# Patient Record
Sex: Male | Born: 1990 | Race: Black or African American | Hispanic: No | Marital: Single | Smoking: Former smoker
Health system: Southern US, Community
[De-identification: ages and names within clinical notes are randomized; demographics above are authoritative.]

---

## 2015-07-08 ENCOUNTER — Emergency Department (HOSPITAL_COMMUNITY)
Admission: EM | Admit: 2015-07-08 | Discharge: 2015-07-08 | Disposition: A | Discharge status: Home / Self Care | Payer: Self-pay | Attending: Emergency Medicine | Admitting: Emergency Medicine

## 2015-07-08 ENCOUNTER — Encounter (HOSPITAL_COMMUNITY): Payer: Self-pay | Admitting: Emergency Medicine

## 2015-07-08 DIAGNOSIS — F17200 Nicotine dependence, unspecified, uncomplicated: Secondary | ICD-10-CM | POA: Insufficient documentation

## 2015-07-08 DIAGNOSIS — L0291 Cutaneous abscess, unspecified: Secondary | ICD-10-CM

## 2015-07-08 DIAGNOSIS — L0231 Cutaneous abscess of buttock: Secondary | ICD-10-CM | POA: Insufficient documentation

## 2015-07-08 MED ORDER — LIDOCAINE-EPINEPHRINE 1 %-1:100000 IJ SOLN
10.0000 mL | Freq: Once | INTRAMUSCULAR | Status: AC
  Administered 2015-07-08: 10 mL via INTRADERMAL
  Filled 2015-07-08: qty 1

## 2015-07-08 NOTE — Discharge Instructions (Signed)
Incision and Drainage °Incision and drainage is a procedure in which a sac-like structure (cystic structure) is opened and drained. The area to be drained usually contains material such as pus, fluid, or blood.  °LET YOUR CAREGIVER KNOW ABOUT:  °· Allergies to medicine. °· Medicines taken, including vitamins, herbs, eyedrops, over-the-counter medicines, and creams. °· Use of steroids (by mouth or creams). °· Previous problems with anesthetics or numbing medicines. °· History of bleeding problems or blood clots. °· Previous surgery. °· Other health problems, including diabetes and kidney problems. °· Possibility of pregnancy, if this applies. °RISKS AND COMPLICATIONS °· Pain. °· Bleeding. °· Scarring. °· Infection. °BEFORE THE PROCEDURE  °You may need to have an ultrasound or other imaging tests to see how large or deep your cystic structure is. Blood tests may also be used to determine if you have an infection or how severe the infection is. You may need to have a tetanus shot. °PROCEDURE  °The affected area is cleaned with a cleaning fluid. The cyst area will then be numbed with a medicine (local anesthetic). A small incision will be made in the cystic structure. A syringe or catheter may be used to drain the contents of the cystic structure, or the contents may be squeezed out. The area will then be flushed with a cleansing solution. After cleansing the area, it is often gently packed with a gauze or another wound dressing. Once it is packed, it will be covered with gauze and tape or some other type of wound dressing.  °AFTER THE PROCEDURE  °· Often, you will be allowed to go home right after the procedure. °· You may be given antibiotic medicine to prevent or heal an infection. °· If the area was packed with gauze or some other wound dressing, you will likely need to come back in 1 to 2 days to get it removed. °· The area should heal in about 14 days. °  °This information is not intended to replace advice given  to you by your health care provider. Make sure you discuss any questions you have with your health care provider. °  °Document Released: 10/17/2000 Document Revised: 10/23/2011 Document Reviewed: 06/18/2011 °Elsevier Interactive Patient Education ©2016 Else[employer] Inc. ° °Abscess °An abscess is an infected area that contains a collection of pus and debris. It can occur in almost any part of the body. An abscess is also known as a furuncle or boil. °CAUSES  °An abscess occurs when tissue gets infected. This can occur from blockage of oil or sweat glands, infection of hair follicles, or a minor injury to the skin. As the body tries to fight the infection, pus collects in the area and creates pressure under the skin. This pressure causes pain. People with weakened immune systems have difficulty fighting infections and get certain abscesses more often.  °SYMPTOMS °Usually an abscess develops on the skin and becomes a painful mass that is red, warm, and tender. If the abscess forms under the skin, you may feel a moveable soft area under the skin. Some abscesses break open (rupture) on their own, but most will continue to get worse without care. The infection can spread deeper into the body and eventually into the bloodstream, causing you to feel ill.  °DIAGNOSIS  °Your caregiver will take your medical history and perform a physical exam. A sample of fluid may also be taken from the abscess to determine what is causing your infection. °TREATMENT  °Your caregiver may prescribe antibiotic medicines to fight the infection.   However, taking antibiotics alone usually does not cure an abscess. Your caregiver may need to make a small cut (incision) in the abscess to drain the pus. In some cases, gauze is packed into the abscess to reduce pain and to continue draining the area. °HOME CARE INSTRUCTIONS  °· Only take over-the-counter or prescription medicines for pain, discomfort, or fever as directed by your caregiver. °· If you were  prescribed antibiotics, take them as directed. Finish them even if you start to feel better. °· If gauze is used, follow your caregiver's directions for changing the gauze. °· To avoid spreading the infection: °¨ Keep your draining abscess covered with a bandage. °¨ Wash your hands well. °¨ Do not share personal care items, towels, or whirlpools with others. °¨ Avoid skin contact with others. °· Keep your skin and clothes clean around the abscess. °· Keep all follow-up appointments as directed by your caregiver. °SEEK MEDICAL CARE IF:  °· You have increased pain, swelling, redness, fluid drainage, or bleeding. °· You have muscle aches, chills, or a general ill feeling. °· You have a fever. °MAKE SURE YOU:  °· Understand these instructions. °· Will watch your condition. °· Will get help right away if you are not doing well or get worse. °  °This information is not intended to replace advice given to you by your health care provider. Make sure you discuss any questions you have with your health care provider. °  °Document Released: 01/31/2005 Document Revised: 10/23/2011 Document Reviewed: 07/06/2011 °Elsevier Interactive Patient Education ©2016 Elsevier Inc. ° °

## 2015-07-08 NOTE — ED Provider Notes (Signed)
CSN: [id number]     Arrival date & time 07/08/15  0840 History  By signing my name below, I, Nathan Dixon, attest that this documentation has been prepared under the direction and in the presence of [employer], PA-C.  Electronically Signed: Lyndel Dixon, ED Scribe. 07/08/2015. 9:28 AM.   Chief Complaint  Patient presents with  . Abscess   The history is provided by the patient. No language interpreter was used.   HPI Comments: Nathan Dixon is a 25 y.o. male, with a h/o abscesses, who presents to the Emergency Department complaining of a constant, gradually worsening area of pain and swelling to right buttock X 3 days, with more recent onset of purulent drainage from the area. Pt reports a significant history of abscesses to buttocks and states the areas normally drain after soaking in hot water, without intervention by a medical provider. He denies relief of his current complaint after soaking in hot water. The pt has not been evaluated by a dermatologist in the past for h/o abscesses. He denies fevers, chills, nausea, vomiting, or any other associated symptoms or areas of infection.  History reviewed. No pertinent past medical history. History reviewed. No pertinent past surgical history. History reviewed. No pertinent family history. Social History  Substance Use Topics  . Smoking status: Current Every Day Smoker  . Smokeless tobacco: None  . Alcohol Use: Yes     Comment: occ    Review of Systems  Constitutional: Negative for fever and chills.  Gastrointestinal: Negative for nausea and vomiting.  Skin: Positive for wound (draining abscess to right buttock ).   Allergies  Review of patient's allergies indicates no known allergies.  Home Medications   Prior to Admission medications   Not on File   BP 114/71 mmHg  Pulse 66  Temp(Src) 98.1 F (36.7 C) (Oral)  Resp 18  SpO2 99%  Physical Exam  Constitutional: He is oriented to person, place, and time. He appears  well-developed and well-nourished. No distress.  HENT:  Head: Normocephalic.  Eyes: Conjunctivae are normal.  Neck: Normal range of motion. Neck supple.  Cardiovascular: Normal rate.   Pulmonary/Chest: Effort normal. No respiratory distress.  Musculoskeletal: Normal range of motion.  1.5 cm abscess to left buttock  Neurological: He is alert and oriented to person, place, and time. Coordination normal.  Skin: Skin is warm.  Psychiatric: He has a normal mood and affect. His behavior is normal.  Nursing note and vitals reviewed.   ED Course  Procedures  DIAGNOSTIC STUDIES: Oxygen Saturation is 99% on RA, normal by my interpretation.    COORDINATION OF CARE: 9:17 AM Discussed treatment plan with pt which includes to perform I & D procedure and pt agreed to plan.  INCISION AND DRAINAGE PROCEDURE NOTE: Patient identification was confirmed and verbal consent was obtained. This procedure was performed by Jeralyn Bennett, PA-C at 9:19 AM. Site: right buttock  Sterile procedures observed Needle size: 27 Anesthetic used (type and amt): 2 cc of lidocaine 1% with epinephrine  Blade size: 11  Drainage: copious purulent, bloody drainage  Site anesthetized, incision made over site, wound drained and explored loculations, rinsed with copious amounts of normal saline, wound covered with dry, sterile dressing.  Pt tolerated procedure well without complications.  Instructions for care discussed verbally and pt provided with additional written instructions for homecare and f/u.   MDM   Final diagnoses:  Abscess   Labs: N/A  Imaging: N/A  Consults: N/A  Therapeutics: N/A  Discharge Meds:  N/A  Assessment/Plan: Perform I&D procedure.    Patient with skin abscess. Incision and drainage performed in the ED today.  Abscess was not large enough to warrant packing or drain placement. Wound recheck in 3 days. Supportive care and return precautions discussed. Pt advised to take ibuprofen as  needed and to take a bath BID until he is seen for wound recheck in 3 days. The patient appears reasonably screened and/or stabilized for discharge and I doubt any other emergent medical condition requiring further screening, evaluation, or treatment in the ED prior to discharge.   I personally performed the services described in this documentation, which was scribed in my presence. The recorded information has been reviewed and is accurate.  Eyvonne MechanicJeffrey Vendetta Pittinger, PA-C 07/08/15 1408  Tilden FossaElizabeth Rees, MD 07/09/15 1100

## 2015-07-08 NOTE — ED Notes (Signed)
Pt c/o abscess on right buttocks area; pt sts hx of same

## 2015-08-19 ENCOUNTER — Emergency Department (HOSPITAL_COMMUNITY)
Admission: EM | Admit: 2015-08-19 | Discharge: 2015-08-19 | Disposition: A | Discharge status: Home / Self Care | Payer: Self-pay | Attending: Emergency Medicine | Admitting: Emergency Medicine

## 2015-08-19 ENCOUNTER — Encounter (HOSPITAL_COMMUNITY): Payer: Self-pay | Admitting: Emergency Medicine

## 2015-08-19 DIAGNOSIS — F17200 Nicotine dependence, unspecified, uncomplicated: Secondary | ICD-10-CM | POA: Insufficient documentation

## 2015-08-19 DIAGNOSIS — J029 Acute pharyngitis, unspecified: Secondary | ICD-10-CM | POA: Insufficient documentation

## 2015-08-19 LAB — RAPID STREP SCREEN (MED CTR MEBANE ONLY): STREPTOCOCCUS, GROUP A SCREEN (DIRECT): NEGATIVE

## 2015-08-19 MED ORDER — IBUPROFEN 200 MG PO TABS
600.0000 mg | ORAL_TABLET | Freq: Once | ORAL | Status: AC
  Administered 2015-08-19: 600 mg via ORAL
  Filled 2015-08-19: qty 3

## 2015-08-19 NOTE — Discharge Instructions (Signed)
Treatment: You may alternate ibuprofen and Tylenol every 3 hours for your pain. You may use Chloraseptic throat spray that you can find over-the-counter for your pain. You can find throat lozenges that are seething to your throat at a local drugstore. If you're strep culture is positive, you will get a call from the hospital and he will be prescribed antibiotics at that time.  Follow-up: Please follow-up with your primary care doctor as needed if symptoms worsen or do not improve within the week. Please return to emergency department if your symptoms worsen, you develop a fever, your pain is worsening, your speech is affected, you cannot open your mouth, or any other new or concerning symptoms.   Pharyngitis Pharyngitis is redness, pain, and swelling (inflammation) of your pharynx.  CAUSES  Pharyngitis is usually caused by infection. Most of the time, these infections are from viruses (viral) and are part of a cold. However, sometimes pharyngitis is caused by bacteria (bacterial). Pharyngitis can also be caused by allergies. Viral pharyngitis may be spread from person to person by coughing, sneezing, and personal items or utensils (cups, forks, spoons, toothbrushes). Bacterial pharyngitis may be spread from person to person by more intimate contact, such as kissing.  SIGNS AND SYMPTOMS  Symptoms of pharyngitis include:   Sore throat.   Tiredness (fatigue).   Low-grade fever.   Headache.  Joint pain and muscle aches.  Skin rashes.  Swollen lymph nodes.  Plaque-like film on throat or tonsils (often seen with bacterial pharyngitis). DIAGNOSIS  Your health care provider will ask you questions about your illness and your symptoms. Your medical history, along with a physical exam, is often all that is needed to diagnose pharyngitis. Sometimes, a rapid strep test is done. Other lab tests may also be done, depending on the suspected cause.  TREATMENT  Viral pharyngitis will usually get  better in 3-4 days without the use of medicine. Bacterial pharyngitis is treated with medicines that kill germs (antibiotics).  HOME CARE INSTRUCTIONS   Drink enough water and fluids to keep your urine clear or pale yellow.   Only take over-the-counter or prescription medicines as directed by your health care provider:   If you are prescribed antibiotics, make sure you finish them even if you start to feel better.   Do not take aspirin.   Get lots of rest.   Gargle with 8 oz of salt water ( tsp of salt per 1 qt of water) as often as every 1-2 hours to soothe your throat.   Throat lozenges (if you are not at risk for choking) or sprays may be used to soothe your throat. SEEK MEDICAL CARE IF:   You have large, tender lumps in your neck.  You have a rash.  You cough up green, yellow-brown, or bloody spit. SEEK IMMEDIATE MEDICAL CARE IF:   Your neck becomes stiff.  You drool or are unable to swallow liquids.  You vomit or are unable to keep medicines or liquids down.  You have severe pain that does not go away with the use of recommended medicines.  You have trouble breathing (not caused by a stuffy nose). MAKE SURE YOU:   Understand these instructions.  Will watch your condition.  Will get help right away if you are not doing well or get worse.   This information is not intended to replace advice given to you by your health care provider. Make sure you discuss any questions you have with your health care provider.  Document Released: 04/23/2005 Document Revised: 02/11/2013 Document Reviewed: 12/29/2012 Elsevier Interactive Patient Education [employer]2016 Elsevier Inc.

## 2015-08-19 NOTE — ED Provider Notes (Signed)
CSN: [id number]     Arrival date & time 08/19/15  [id number] History   First MD Initiated Contact with Patient 08/19/15 [phone]     Chief Complaint  Patient presents with  . Sore Throat     (Consider location/radiation/quality/duration/timing/severity/associated sxs/prior Treatment) HPI Comments: Patient is a 25 year old previously healthy male who presents today with sore throat. Patient has had the sore throat for 2 days. He has an associated infrequent cough is nonproductive. Is no associated congestion or fevers. Patient has not tried any medications at home but has gargled with salt water without much relief. Patient has no known sick contacts. Patient denies feeling of his throat is closing. Patient denies any chest pain, shortness of breath, abdominal pain, nausea, vomiting, dysuria.  Patient is a 25 y.o. male presenting with pharyngitis. The history is provided by the patient.  Sore Throat Associated symptoms include coughing and a sore throat. Pertinent negatives include no abdominal pain, chest pain, chills, fever, headaches, nausea, rash or vomiting.    History reviewed. No pertinent past medical history. History reviewed. No pertinent past surgical history. History reviewed. No pertinent family history. Social History  Substance Use Topics  . Smoking status: Current Every Day Smoker  . Smokeless tobacco: None  . Alcohol Use: Yes     Comment: occ    Review of Systems  Constitutional: Negative for fever and chills.  HENT: Positive for sore throat. Negative for facial swelling.   Respiratory: Positive for cough. Negative for shortness of breath.   Cardiovascular: Negative for chest pain.  Gastrointestinal: Negative for nausea, vomiting and abdominal pain.  Genitourinary: Negative for dysuria.  Musculoskeletal: Negative for back pain.  Skin: Negative for rash and wound.  Neurological: Negative for headaches.  Psychiatric/Behavioral: The patient is not nervous/anxious.        Allergies  Review of patient's allergies indicates no known allergies.  Home Medications   Prior to Admission medications   Not on File   BP 124/70 mmHg  Pulse 86  Temp(Src) 98.8 F (37.1 C) (Oral)  Resp 16  Ht 5\' 11"  (1.803 m)  Wt 79.379 kg  BMI 24.42 kg/m2  SpO2 100% Physical Exam  Constitutional: He appears well-developed and well-nourished. No distress.  HENT:  Head: Normocephalic and atraumatic.  Mouth/Throat: Uvula is midline and mucous membranes are normal. No uvula swelling. Oropharyngeal exudate, posterior oropharyngeal edema and posterior oropharyngeal erythema present. No tonsillar abscesses.  Eyes: Conjunctivae are normal. Pupils are equal, round, and reactive to light. Right eye exhibits no discharge. Left eye exhibits no discharge. No scleral icterus.  Neck: Normal range of motion. Neck supple. No thyromegaly present.  Cardiovascular: Normal rate, regular rhythm and normal heart sounds.  Exam reveals no gallop and no friction rub.   No murmur heard. Pulmonary/Chest: Effort normal and breath sounds normal. No stridor. No respiratory distress. He has no wheezes. He has no rales.  Abdominal: Soft. Bowel sounds are normal. He exhibits no distension. There is no tenderness. There is no rebound and no guarding.  Musculoskeletal: He exhibits no edema.  Lymphadenopathy:    He has no cervical adenopathy.  Neurological: He is alert. Coordination normal.  Skin: Skin is warm and dry. No rash noted. He is not diaphoretic. No pallor.  Psychiatric: He has a normal mood and affect.  Nursing note and vitals reviewed.   ED Course  Procedures (including critical care time) Labs Review Labs Reviewed  [facility]AP[facility]EN [facility])  CULTURE, GROUP A STREP [facility])  Imaging Review No results found. I have personally reviewed and evaluated these images and lab results as part of my medical decision-making.   EKG Interpretation None      MDM   Pt with  negative strep. Diagnosis of viral pharyngitis. No abx indicated at this time. Discussed that results of strep culture are pending and patient will be informed if positive result and abx will be called in at that time. Discharge with symptomatic tx. No evidence of dehydration. Pt is tolerating secretions. Presentation not concerning for peritonsillar abscess or spread of infection to deep spaces of the throat; patent airway. Specific return precautions discussed. Recommended PCP follow up. Pt vitals stable and appears safe for discharge.   Final diagnoses:  Pharyngitis       Emi Holes, PA-C 08/19/15 1045  Loren Racer, MD 08/23/15 [phone]

## 2015-08-19 NOTE — ED Notes (Signed)
Sore throat for two days. Denies fever or chills at home.

## 2015-08-20 ENCOUNTER — Emergency Department (HOSPITAL_COMMUNITY)
Admission: EM | Admit: 2015-08-20 | Discharge: 2015-08-20 | Disposition: A | Discharge status: Home / Self Care | Payer: Self-pay | Attending: Emergency Medicine | Admitting: Emergency Medicine

## 2015-08-20 ENCOUNTER — Encounter (HOSPITAL_COMMUNITY): Payer: Self-pay | Admitting: *Deleted

## 2015-08-20 DIAGNOSIS — J039 Acute tonsillitis, unspecified: Secondary | ICD-10-CM | POA: Insufficient documentation

## 2015-08-20 DIAGNOSIS — F17200 Nicotine dependence, unspecified, uncomplicated: Secondary | ICD-10-CM | POA: Insufficient documentation

## 2015-08-20 DIAGNOSIS — H9201 Otalgia, right ear: Secondary | ICD-10-CM | POA: Insufficient documentation

## 2015-08-20 MED ORDER — HYDROCODONE-ACETAMINOPHEN 7.5-325 MG/15ML PO SOLN: 10.0000 mL | Freq: Four times a day (QID) | ORAL | Status: DC | PRN

## 2015-08-20 MED ORDER — IBUPROFEN 800 MG PO TABS: 800.0000 mg | ORAL_TABLET | Freq: Three times a day (TID) | ORAL | Status: DC | PRN

## 2015-08-20 MED ORDER — CLINDAMYCIN HCL 150 MG PO CAPS: 300.0000 mg | ORAL_CAPSULE | Freq: Three times a day (TID) | ORAL | Status: DC

## 2015-08-20 NOTE — ED Notes (Signed)
Pt reports being seen yesterday and diagnosed with layrngitis. Pt woke up this am and now has right ear pain. No acute distress noted at triage.

## 2015-08-20 NOTE — ED Notes (Signed)
Patient verbalized understanding of discharge instructions and denies any further needs or questions at this time. VS stable. Patient ambulatory with steady gait.  

## 2015-08-20 NOTE — Discharge Instructions (Signed)
Read the information below.  Use the prescribed medication as directed.  Please discuss all new medications with your pharmacist.  Do not take additional tylenol while taking the prescribed pain medication to avoid overdose.  You may return to the Emergency Department at any time for worsening condition or any new symptoms that concern you.    If you develop high fevers, difficulty swallowing or breathing, or you are unable to tolerate fluids by mouth, return to the ER immediately for a recheck.     Tonsillitis Tonsillitis is an infection of the throat that causes the tonsils to become red, tender, and swollen. Tonsils are collections of lymphoid tissue at the back of the throat. Each tonsil has crevices (crypts). Tonsils help fight nose and throat infections and keep infection from spreading to other parts of the body for the first 18 months of life.  CAUSES Sudden (acute) tonsillitis is usually caused by infection with streptococcal bacteria. Long-lasting (chronic) tonsillitis occurs when the crypts of the tonsils become filled with pieces of food and bacteria, which makes it easy for the tonsils to become repeatedly infected. SYMPTOMS  Symptoms of tonsillitis include:  A sore throat, with possible difficulty swallowing.  White patches on the tonsils.  Fever.  Tiredness.  New episodes of snoring during sleep, when you did not snore before.  Small, foul-smelling, yellowish-white pieces of material (tonsilloliths) that you occasionally cough up or spit out. The tonsilloliths can also cause you to have bad breath. DIAGNOSIS Tonsillitis can be diagnosed through a physical exam. Diagnosis can be confirmed with the results of lab tests, including a throat culture. TREATMENT  The goals of tonsillitis treatment include the reduction of the severity and duration of symptoms and prevention of associated conditions. Symptoms of tonsillitis can be improved with the use of steroids to reduce the  swelling. Tonsillitis caused by bacteria can be treated with antibiotic medicines. Usually, treatment with antibiotic medicines is started before the cause of the tonsillitis is known. However, if it is determined that the cause is not bacterial, antibiotic medicines will not treat the tonsillitis. If attacks of tonsillitis are severe and frequent, your health care provider may recommend surgery to remove the tonsils (tonsillectomy). HOME CARE INSTRUCTIONS   Rest as much as possible and get plenty of sleep.  Drink plenty of fluids. While the throat is very sore, eat soft foods or liquids, such as sherbet, soups, or instant breakfast drinks.  Eat frozen ice pops.  Gargle with a warm or cold liquid to help soothe the throat. Mix 1/4 teaspoon of salt and 1/4 teaspoon of baking soda in 8 oz of water. SEEK MEDICAL CARE IF:   Large, tender lumps develop in your neck.  A rash develops.  A green, yellow-brown, or bloody substance is coughed up.  You are unable to swallow liquids or food for 24 hours.  You notice that only one of the tonsils is swollen. SEEK IMMEDIATE MEDICAL CARE IF:   You develop any new symptoms such as vomiting, severe headache, stiff neck, chest pain, or trouble breathing or swallowing.  You have severe throat pain along with drooling or voice changes.  You have severe pain, unrelieved with recommended medications.  You are unable to fully open the mouth.  You develop redness, swelling, or severe pain anywhere in the neck.  You have a fever. MAKE SURE YOU:   Understand these instructions.  Will watch your condition.  Will get help right away if you are not doing well or  get worse.   This information is not intended to replace advice given to you by your health care provider. Make sure you discuss any questions you have with your health care provider.   Document Released: 01/31/2005 Document Revised: 05/14/2014 Document Reviewed: 10/10/2012 Elsevier  Interactive Patient Education [employer]2016 Elsevier Inc.

## 2015-08-20 NOTE — ED Provider Notes (Signed)
CSN: [id number]     Arrival date & time 08/20/15  1501 History   First MD Initiated Contact with Patient 08/20/15 1524     Chief Complaint  Patient presents with  . Otalgia     (Consider location/radiation/quality/duration/timing/severity/associated sxs/prior Treatment) The history is provided by the patient.     Patient presents with sore throat and right ear pain.  Has been sick for 3 days, mostly with sore throat, occasional cough.  At 2am today his right ear started throbbing.  Felt some improvement of sore throat with 400mg  ibuprofen Q 6-8 hours but relief only lasted very short amount of time.  States the 600mg   Given yesterday helped much more.    History reviewed. No pertinent past medical history. History reviewed. No pertinent past surgical history. History reviewed. No pertinent family history. Social History  Substance Use Topics  . Smoking status: Current Every Day Smoker  . Smokeless tobacco: None  . Alcohol Use: Yes     Comment: occ    Review of Systems  Constitutional: Positive for chills.  HENT: Positive for ear pain and sore throat. Negative for congestion, dental problem, drooling, sinus pressure and trouble swallowing.   Respiratory: Positive for cough (occasional ). Negative for choking, shortness of breath and wheezing.   Musculoskeletal: Negative for myalgias and neck stiffness.  Allergic/Immunologic: Negative for immunocompromised state.  Psychiatric/Behavioral: Negative for self-injury.      Allergies  Review of patient's allergies indicates no known allergies.  Home Medications   Prior to Admission medications   Not on File   BP 122/61 mmHg  Pulse 100  Temp(Src) 100 F (37.8 C) (Oral)  Resp 16  SpO2 99% Physical Exam  Constitutional: He appears well-developed and well-nourished. No distress.  HENT:  Head: Normocephalic and atraumatic.  Right Ear: Tympanic membrane normal.  Left Ear: Tympanic membrane normal.  Mouth/Throat: Uvula is  midline. No trismus in the jaw. No uvula swelling. Oropharyngeal exudate, posterior oropharyngeal edema and posterior oropharyngeal erythema present.  Tonsillar swelling R>L with white and black exudate associated with right tonsil.  Associate cervical lymphadenopathy  Eyes: Conjunctivae and EOM are normal. Right eye exhibits no discharge. Left eye exhibits no discharge.  Neck: Normal range of motion. Neck supple.  Cardiovascular: Normal rate and regular rhythm.   Pulmonary/Chest: Effort normal and breath sounds normal. No stridor. No respiratory distress. He has no wheezes. He has no rales.  Lymphadenopathy:    He has no cervical adenopathy.  Neurological: He is alert.  Skin: He is not diaphoretic.  Nursing note and vitals reviewed.   ED Course  Procedures (including critical care time) Labs Review Labs Reviewed - No data to display  Imaging Review No results found. I have personally reviewed and evaluated these images and lab results as part of my medical decision-making.   EKG Interpretation None      MDM   Final diagnoses:  Acute tonsillitis, unspecified etiology    Afebrile, nontoxic patient with sore throat.  Strep screen negative yesterday.  No airway concerns.  Right tonsil is abnormal, with exudate. Possible but less likely early peritonsillar abscess but absolutely no airway concerns.  Discussed strict return precautions including treatment and follow up. With 4 points per [facility] criteria and abnormal appearance to right tonsil, will treat as strep.  D/C home with antibiotics, pain medication, ENT prn follow up.  Discussed result, findings, treatment, and follow up  with patient.  Pt given return precautions.  Pt verbalizes understanding and agrees with  plan.        Trixie Dredge, PA-C 08/20/15 1705  Doug Sou, MD 08/20/15 1728

## 2015-08-20 NOTE — ED Notes (Signed)
PA-C to see and assess patient before RN assessment. See PA note. 

## 2015-08-21 LAB — CULTURE, GROUP A STREP (THRC)

## 2015-08-25 ENCOUNTER — Encounter (HOSPITAL_COMMUNITY): Payer: Self-pay | Admitting: Emergency Medicine

## 2015-08-25 ENCOUNTER — Emergency Department (HOSPITAL_COMMUNITY)
Admission: EM | Admit: 2015-08-25 | Discharge: 2015-08-25 | Disposition: A | Discharge status: Home / Self Care | Payer: Self-pay | Attending: Emergency Medicine | Admitting: Emergency Medicine

## 2015-08-25 DIAGNOSIS — J029 Acute pharyngitis, unspecified: Secondary | ICD-10-CM | POA: Insufficient documentation

## 2015-08-25 DIAGNOSIS — F17200 Nicotine dependence, unspecified, uncomplicated: Secondary | ICD-10-CM | POA: Insufficient documentation

## 2015-08-25 DIAGNOSIS — K0889 Other specified disorders of teeth and supporting structures: Secondary | ICD-10-CM | POA: Insufficient documentation

## 2015-08-25 DIAGNOSIS — Z791 Long term (current) use of non-steroidal anti-inflammatories (NSAID): Secondary | ICD-10-CM | POA: Insufficient documentation

## 2015-08-25 LAB — BASIC METABOLIC PANEL
ANION GAP: 11 (ref 5–15)
BUN: 8 mg/dL (ref 6–20)
CHLORIDE: 107 mmol/L (ref 101–111)
CO2: 24 mmol/L (ref 22–32)
Calcium: 9.2 mg/dL (ref 8.9–10.3)
Creatinine, Ser: 0.86 mg/dL (ref 0.61–1.24)
GFR calc Af Amer: >60 mL/min (ref >60)
Glucose, Bld: 78 mg/dL (ref 65–99)
POTASSIUM: 4.3 mmol/L (ref 3.5–5.1)
SODIUM: 142 mmol/L (ref 135–145)

## 2015-08-25 LAB — CBC WITH DIFFERENTIAL/PLATELET
BASOS ABS: 0 10*3/uL (ref 0.0–0.1)
Basophils Relative: 1 %
EOS PCT: 1 %
Eosinophils Absolute: 0.1 10*3/uL (ref 0.0–0.7)
HEMATOCRIT: 45.5 % (ref 39.0–52.0)
HEMOGLOBIN: 15 g/dL (ref 13.0–17.0)
LYMPHS ABS: 2.6 10*3/uL (ref 0.7–4.0)
LYMPHS PCT: 42 %
MCH: 29.5 pg (ref 26.0–34.0)
MCHC: 33 g/dL (ref 30.0–36.0)
MCV: 89.4 fL (ref 78.0–100.0)
Monocytes Absolute: 0.6 10*3/uL (ref 0.1–1.0)
Monocytes Relative: 10 %
NEUTROS ABS: 2.9 10*3/uL (ref 1.7–7.7)
Neutrophils Relative %: 46 %
Platelets: 316 10*3/uL (ref 150–400)
RBC: 5.09 MIL/uL (ref 4.22–5.81)
RDW: 14 % (ref 11.5–15.5)
WBC: 6.2 10*3/uL (ref 4.0–10.5)

## 2015-08-25 MED ORDER — HYDROCODONE-ACETAMINOPHEN 10-300 MG/15ML PO SOLN: 7.5000 mL | Freq: Three times a day (TID) | ORAL | Status: DC | PRN

## 2015-08-25 MED ORDER — KETOROLAC TROMETHAMINE 30 MG/ML IJ SOLN
30.0000 mg | Freq: Once | INTRAMUSCULAR | Status: AC
  Administered 2015-08-25: 30 mg via INTRAVENOUS
  Filled 2015-08-25: qty 1

## 2015-08-25 MED ORDER — CLINDAMYCIN PHOSPHATE 600 MG/50ML IV SOLN
600.0000 mg | Freq: Once | INTRAVENOUS | Status: AC
  Administered 2015-08-25: 600 mg via INTRAVENOUS
  Filled 2015-08-25: qty 50

## 2015-08-25 MED ORDER — LIDOCAINE VISCOUS 2 % MT SOLN: 10.0000 mL | OROMUCOSAL | Status: DC | PRN

## 2015-08-25 MED ORDER — CLINDAMYCIN HCL 150 MG PO CAPS: 300.0000 mg | ORAL_CAPSULE | Freq: Three times a day (TID) | ORAL | Status: DC

## 2015-08-25 MED ORDER — DEXAMETHASONE SODIUM PHOSPHATE 10 MG/ML IJ SOLN
10.0000 mg | Freq: Once | INTRAMUSCULAR | Status: AC
  Administered 2015-08-25: 10 mg via INTRAVENOUS
  Filled 2015-08-25: qty 1

## 2015-08-25 MED ORDER — SODIUM CHLORIDE 0.9 % IV BOLUS (SEPSIS)
1000.0000 mL | Freq: Once | INTRAVENOUS | Status: AC
  Administered 2015-08-25: 1000 mL via INTRAVENOUS

## 2015-08-25 MED ORDER — ONDANSETRON HCL 4 MG/2ML IJ SOLN
4.0000 mg | Freq: Once | INTRAMUSCULAR | Status: AC
  Administered 2015-08-25: 4 mg via INTRAVENOUS
  Filled 2015-08-25: qty 2

## 2015-08-25 NOTE — ED Notes (Signed)
Patient tolerated PO challenge well.  

## 2015-08-25 NOTE — ED Notes (Signed)
Seen in ED twice last week stated diagnosed with Acute Tonsillitis. Given a prescription for antibiotics 5 days ago however does not have money until tomorrow to get antibiotics. States still have pain 8/10 soreness with radiating general dental pain. Airway intact bilateral equal chest rise and fall.

## 2015-08-25 NOTE — Discharge Instructions (Signed)

## 2015-08-25 NOTE — ED Provider Notes (Signed)
CSN: [id number]     Arrival date & time 08/25/15  4098 History   First MD Initiated Contact with Patient 08/25/15 1005     Chief Complaint  Patient presents with  . Sore Throat  . Dental Pain     (Consider location/radiation/quality/duration/timing/severity/associated sxs/prior Treatment) HPI   Nathan Dixon is a 25 year old male, current smoker, otherwise healthy, return to the ER with complaint of worsening sore throat with difficulty swallowing, and associated right otalgia, radiation of pain to his right face, teeth and hard palate.  Pain is rated 10/10, worse with attempting to eat and swallow, no relieving factors.  He has been evaluated the ER 2 times in the past week however he states he is unable to afford clindamycin abx. He has been able to sip on water couple times a day, but unable to eat much for the past week, and feels dehydrated with generalized malaise. He denies any difficulty breathing, no recent URI, no cough, SOB, CP, abdominal pain. No muffled voice. He denies fever, chills, sweats, nausea, vomiting.  No history of frequent sore throats.  History reviewed. No pertinent past medical history. History reviewed. No pertinent past surgical history. No family history on file. Social History  Substance Use Topics  . Smoking status: Current Every Day Smoker  . Smokeless tobacco: None  . Alcohol Use: Yes     Comment: occ    Review of Systems  All other systems reviewed and are negative.     Allergies  Review of patient's allergies indicates no known allergies.  Home Medications   Prior to Admission medications   Medication Sig Start Date End Date Taking? Authorizing Provider  ibuprofen (ADVIL,MOTRIN) 800 MG tablet Take 1 tablet (800 mg total) by mouth every 8 (eight) hours as needed for mild pain or moderate pain. 08/20/15  Yes Trixie Dredge, PA-C  Ibuprofen-Diphenhydramine Cit (ADVIL PM PO) Take 1 tablet by mouth daily.   Yes Historical Provider, MD  clindamycin  (CLEOCIN) 150 MG capsule Take 2 capsules (300 mg total) by mouth 3 (three) times daily. X 7 days 08/25/15   Danelle Berry, PA-C  Hydrocodone-Acetaminophen 10-300 MG/15ML SOLN Take 7.5 mLs by mouth 3 (three) times daily as needed (for pain). 08/25/15   Danelle Berry, PA-C  lidocaine (XYLOCAINE) 2 % solution Use as directed 10 mLs in the mouth or throat as needed for mouth pain. 08/25/15   Danelle Berry, PA-C   BP 115/63 mmHg  Pulse 72  Temp(Src) 98 F (36.7 C) (Oral)  Resp 16  Ht  (1.803 m)  Wt 77.111 kg  BMI 23.72 kg/m2  SpO2 97% Physical Exam  Constitutional: He is oriented to person, place, and time. He appears well-developed and well-nourished. No distress.  Mildly ill appearing male, NAD  HENT:  Head: Normocephalic and atraumatic.  Right Ear: Tympanic membrane and external ear normal.  Left Ear: Tympanic membrane and external ear normal.  Nose: Nose normal.  Mouth/Throat: Uvula is midline. Mucous membranes are not pale, dry and not cyanotic. Oropharyngeal exudate, posterior oropharyngeal edema and posterior oropharyngeal erythema present. No tonsillar abscesses.  Mucous membranes dry Posterior oropharynx erythematous with multiple ulcerations or lesions Uvula midline with lesions, edema and erythema Bilateral tonsils 2-3+ with exudate, lesions, and erythema  Eyes: Conjunctivae and EOM are normal. Pupils are equal, round, and reactive to light.  Neck: Normal range of motion. Neck supple. No tracheal deviation present.  Cardiovascular: Normal rate, regular rhythm, normal heart sounds and intact distal pulses.  Exam reveals  no gallop and no friction rub.   No murmur heard. Pulmonary/Chest: Effort normal and breath sounds normal. No stridor. No respiratory distress. He has no wheezes.  No tripoding, no stridor  Abdominal: Soft. Bowel sounds are normal. He exhibits no distension. There is no tenderness. There is no rebound and no guarding.  Musculoskeletal: Normal range of motion.    Lymphadenopathy:    He has no cervical adenopathy.  Neurological: He is alert and oriented to person, place, and time. No cranial nerve deficit. He exhibits normal muscle tone. Coordination normal.  Skin: Skin is warm and dry. No rash noted. He is not diaphoretic. No erythema. No pallor.  Psychiatric: He has a normal mood and affect. His behavior is normal. Judgment and thought content normal.  Nursing note and vitals reviewed.     ED Course  Procedures (including critical care time) Labs Review Labs Reviewed  BASIC METABOLIC PANEL  CBC WITH DIFFERENTIAL/PLATELET    Imaging Review No results found. I have personally reviewed and evaluated these images and lab results as part of my medical decision-making.   EKG Interpretation None      MDM   Pt with more than one week of ST, returns to the ER for his third visit, reports worsening pain and unable to afford clindamycin.   Tonsils appear infected, however no concern for PTA, will give fluids, decadron, toradol, IV dose of abx, and re-evaluate for ability to swallow meds.  Pt had successful PO fluid trial in the ER. Lab work is reassuring, normal electrolytes, no white count, vital signs stable. Feel he is safe to discharge home.  Patient was given liquid pain medication, Viscous Lidocaine, clindamycin prescription reprinted for him, additionally given discounted cards from [facility], meds cost less than $20, encouraged to fill the medicines and take as prescribed.  Final diagnoses:  Pharyngitis      Danelle BerryLeisa Inas Avena, PA-C 08/25/15 1223  Linwood DibblesJon Knapp, MD 08/25/15 1226

## 2015-12-08 ENCOUNTER — Emergency Department (HOSPITAL_COMMUNITY)
Admission: EM | Admit: 2015-12-08 | Discharge: 2015-12-08 | Disposition: A | Discharge status: Home / Self Care | Payer: Self-pay | Attending: Emergency Medicine | Admitting: Emergency Medicine

## 2015-12-08 ENCOUNTER — Encounter (HOSPITAL_COMMUNITY): Payer: Self-pay | Admitting: *Deleted

## 2015-12-08 ENCOUNTER — Emergency Department (HOSPITAL_COMMUNITY): Payer: Self-pay

## 2015-12-08 DIAGNOSIS — R0782 Intercostal pain: Secondary | ICD-10-CM | POA: Insufficient documentation

## 2015-12-08 DIAGNOSIS — F17200 Nicotine dependence, unspecified, uncomplicated: Secondary | ICD-10-CM | POA: Insufficient documentation

## 2015-12-08 DIAGNOSIS — R079 Chest pain, unspecified: Secondary | ICD-10-CM

## 2015-12-08 DIAGNOSIS — M7918 Myalgia, other site: Secondary | ICD-10-CM

## 2015-12-08 LAB — BASIC METABOLIC PANEL
Anion gap: 8 (ref 5–15)
CALCIUM: 9 mg/dL (ref 8.9–10.3)
CO2: 25 mmol/L (ref 22–32)
Chloride: 108 mmol/L (ref 101–111)
Creatinine, Ser: 0.84 mg/dL (ref 0.61–1.24)
GLUCOSE: 93 mg/dL (ref 65–99)
Potassium: 3.8 mmol/L (ref 3.5–5.1)
SODIUM: 141 mmol/L (ref 135–145)

## 2015-12-08 LAB — I-STAT TROPONIN, ED
TROPONIN I, POC: 0 ng/mL (ref 0.00–0.08)
TROPONIN I, POC: 0 ng/mL (ref 0.00–0.08)

## 2015-12-08 LAB — CBC
HCT: 44.8 % (ref 39.0–52.0)
HEMOGLOBIN: 14.7 g/dL (ref 13.0–17.0)
MCH: 29.2 pg (ref 26.0–34.0)
MCHC: 32.8 g/dL (ref 30.0–36.0)
MCV: 88.9 fL (ref 78.0–100.0)
PLATELETS: 234 10*3/uL (ref 150–400)
RBC: 5.04 MIL/uL (ref 4.22–5.81)
RDW: 14.3 % (ref 11.5–15.5)
WBC: 5.5 10*3/uL (ref 4.0–10.5)

## 2015-12-08 MED ORDER — NAPROXEN 375 MG PO TABS: 375.0000 mg | ORAL_TABLET | Freq: Two times a day (BID) | ORAL | 0 refills | Status: DC

## 2015-12-08 MED ORDER — NAPROXEN 250 MG PO TABS
375.0000 mg | ORAL_TABLET | Freq: Once | ORAL | Status: AC
  Administered 2015-12-08: 375 mg via ORAL
  Filled 2015-12-08: qty 2

## 2015-12-08 MED ORDER — CYCLOBENZAPRINE HCL 10 MG PO TABS
10.0000 mg | ORAL_TABLET | Freq: Once | ORAL | Status: AC
  Administered 2015-12-08: 10 mg via ORAL
  Filled 2015-12-08: qty 1

## 2015-12-08 MED ORDER — CYCLOBENZAPRINE HCL 10 MG PO TABS: 10.0000 mg | ORAL_TABLET | Freq: Three times a day (TID) | ORAL | 0 refills | Status: DC | PRN

## 2015-12-08 NOTE — ED Provider Notes (Signed)
[facility]-EMERGENCY DEPT Provider Note   CSN: [id number] Arrival date & time: 12/08/15  9833  First Provider Contact:  First MD Initiated Contact with Patient 12/08/15 [phone]      History   Chief Complaint Chief Complaint  Patient presents with  . Chest Pain    HPI Nathan Dixon is a 25 y.o. male.  HPI 25 year old male with no significant past medical history presents with transient, sharp, right-sided chest pain. The patient states he was in his usual state of health upon going to bed last night. Upon waking up, he started brushing his teeth and then felt an acute, transient, sharp, right-sided chest pain. It was in between his ribs. It was made worse with movement and palpation. It last approximately 2 seconds, then resolved completely. He laid back down and the pain went away. He woke back up and began dressing in the next again experienced transient, less than 2 second sharp right-sided chest wall pain. Denies any associated shortness of breath or diaphoresis. He has no significant personal or family history of coronary disease, although his dad has had multiple artifacts over the age of 48. Denies any lower extremity swelling. No recent travel. His symptoms are now completely resolved. Of note, he works in a on [employer] with frequent, regular use of his right arm.  History reviewed. No pertinent past medical history.  There are no active problems to display for this patient.   History reviewed. No pertinent surgical history.     Home Medications    Prior to Admission medications   Medication Sig Start Date End Date Taking? Authorizing Provider  acetaminophen (TYLENOL) 500 MG tablet Take 1,000 mg by mouth every 6 (six) hours as needed for moderate pain or headache.   Yes Historical Provider, MD  clindamycin (CLEOCIN) 150 MG capsule Take 2 capsules (300 mg total) by mouth 3 (three) times daily. X 7 days Patient not taking: Reported on 12/08/2015 08/25/15   Danelle Berry, PA-C    cyclobenzaprine (FLEXERIL) 10 MG tablet Take 1 tablet (10 mg total) by mouth 3 (three) times daily as needed for muscle spasms. 12/08/15   Shaune Pollack, MD  Hydrocodone-Acetaminophen 10-300 MG/15ML SOLN Take 7.5 mLs by mouth 3 (three) times daily as needed (for pain). Patient not taking: Reported on 12/08/2015 08/25/15   Danelle Berry, PA-C  ibuprofen (ADVIL,MOTRIN) 800 MG tablet Take 1 tablet (800 mg total) by mouth every 8 (eight) hours as needed for mild pain or moderate pain. Patient not taking: Reported on 12/08/2015 08/20/15   Trixie Dredge, PA-C  lidocaine (XYLOCAINE) 2 % solution Use as directed 10 mLs in the mouth or throat as needed for mouth pain. Patient not taking: Reported on 12/08/2015 08/25/15   Danelle Berry, PA-C  naproxen (NAPROSYN) 375 MG tablet Take 1 tablet (375 mg total) by mouth 2 (two) times daily. 12/08/15   Shaune Pollack, MD    Family History History reviewed. No pertinent family history.  Social History Social History  Substance Use Topics  . Smoking status: Current Every Day Smoker  . Smokeless tobacco: Not on file  . Alcohol use Yes     Comment: occ     Allergies   Review of patient's allergies indicates no known allergies.   Review of Systems Review of Systems  Constitutional: Negative for chills, fatigue and fever.  HENT: Negative for congestion and rhinorrhea.   Eyes: Negative for visual disturbance.  Respiratory: Negative for cough, shortness of breath and wheezing.   Cardiovascular: Positive for  chest pain (Transient, now resolved). Negative for leg swelling.  Gastrointestinal: Negative for abdominal pain, diarrhea, nausea and vomiting.  Genitourinary: Negative for dysuria and flank pain.  Musculoskeletal: Negative for neck pain and neck stiffness.  Skin: Negative for rash and wound.  Allergic/Immunologic: Negative for immunocompromised state.  Neurological: Negative for syncope, weakness and headaches.     Physical Exam Updated Vital Signs BP 106/70  (BP Location: Right Arm)   Pulse 69   Temp 98 F (36.7 C) (Oral)   Resp 17   SpO2 100%   Physical Exam  Constitutional: He appears well-developed and well-nourished. No distress.  HENT:  Head: Normocephalic.  Mouth/Throat: Oropharynx is clear and moist. No oropharyngeal exudate.  Eyes: Conjunctivae are normal. Pupils are equal, round, and reactive to light.  Neck: Normal range of motion. Neck supple.  Cardiovascular: Normal rate, regular rhythm, normal heart sounds and intact distal pulses.  Exam reveals no friction rub.   No murmur heard. Pulmonary/Chest: Effort normal and breath sounds normal. No respiratory distress. He has no wheezes. He has no rales. He exhibits no tenderness (No overt chest wall tenderness. No bruising or deformity.).  Abdominal: Soft. He exhibits no distension. There is no tenderness.  Musculoskeletal: He exhibits no edema.  Neurological: He is alert. He exhibits normal muscle tone.  Skin: Skin is warm. Capillary refill takes less than 2 seconds. No rash noted.  Nursing note and vitals reviewed.    ED Treatments / Results  Labs (all labs ordered are listed, but only abnormal results are displayed) Labs Reviewed  BASIC METABOLIC PANEL - Abnormal; Notable for the following:       Result Value   BUN <5 (*)    All other components within normal limits  CBC  I-STAT TROPOININ, ED  I-STAT TROPOININ, ED    EKG  EKG Interpretation  Date/Time:  Thursday December 08 2015 08:45:16 EDT Ventricular Rate:  85 PR Interval:    QRS Duration: 86 QT Interval:  358 QTC Calculation: 426 R Axis:   67 Text Interpretation:  Sinus rhythm No old tracing to compare Normal ECG Confirmed by Erma Heritage MD, Sheria Lang [phone]) on 12/08/2015 8:53:53 AM Also confirmed by Erma Heritage MD, Quinn Bartling [phone]), editor Stout CT, Jola Babinski [phone])  on 12/08/2015 11:19:15 AM       Radiology Dg Chest 2 View  Result Date: 12/08/2015 CLINICAL DATA:  Chest pain beginning earlier today. EXAM: CHEST  2 VIEW  COMPARISON:  None. FINDINGS: Heart size is normal. Mediastinal shadows are normal. Lungs are clear. The vascularity is normal. No effusions. No bony abnormalities other than mild spinal curvature. IMPRESSION: Normal chest other than mild spinal curvature Electronically Signed   By: Paulina Fusi M.D.   On: 12/08/2015 09:56    Procedures Procedures (including critical care time)  Medications Ordered in ED Medications  naproxen (NAPROSYN) tablet 375 mg (375 mg Oral Given 12/08/15 0934)  cyclobenzaprine (FLEXERIL) tablet 10 mg (10 mg Oral Given 12/08/15 0935)     Initial Impression / Assessment and Plan / ED Course  I have reviewed the triage vital signs and the nursing notes.  Pertinent labs & imaging results that were available during my care of the patient were reviewed by me and considered in my medical decision making (see chart for details).  Clinical Course   25 year old male with no significant past medical history presents with atypical, sharp, transient right-sided chest pain. See history of present illness above. On arrival, vital signs are stable and within normal limits. Examination  is as above. Primary suspicion is muscular skeletal pain, likely secondary to intercostal sprain versus costrochondritis. 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 He has a hear score less than 3 and delta troponin is negative; I do not suspect ACS. He has no lower extremity swelling, DVT, risk factors, and is PERC negative. I do not suspect PE. Chest x-ray shows no evidence of pneumonia, pneumothorax, or other pulmonary abnormality. The pain is transient, he has no hypertension, and I do not suspect dissection. Will treat supportively with NSAIDs, Flexeril, and outpatient follow-up.  Final Clinical Impressions(s) / ED Diagnoses   Final diagnoses:    Intercostal muscle pain  Chest pain, unspecified chest pain type    New Prescriptions Discharge Medication List as of 12/08/2015  2:01 PM    START taking these medications   Details  cyclobenzaprine (FLEXERIL) 10 MG tablet Take 1 tablet (10 mg total) by mouth 3 (three) times daily as needed for muscle spasms., Starting Thu 12/08/2015, Print    naproxen (NAPROSYN) 375 MG tablet Take 1 tablet (375 mg total) by mouth 2 (two) times daily., Starting Thu 12/08/2015, Print         Shaune Pollack, MD 12/08/15 2034

## 2015-12-08 NOTE — ED Notes (Signed)
Pt is in stable condition upon d/c and ambulates from ED. 

## 2015-12-08 NOTE — ED Triage Notes (Signed)
Pt reports mid sharp intermittent chest pains. Denies sob. Pain increases with movement and breathing, denies recent cough. Hx of anxiety.

## 2015-12-08 NOTE — ED Notes (Signed)
Called phlebotomy to get repeat trop.

## 2016-02-06 ENCOUNTER — Emergency Department (HOSPITAL_COMMUNITY)
Admission: EM | Admit: 2016-02-06 | Discharge: 2016-02-06 | Disposition: A | Discharge status: Home / Self Care | Payer: Self-pay | Attending: Emergency Medicine | Admitting: Emergency Medicine

## 2016-02-06 ENCOUNTER — Encounter (HOSPITAL_COMMUNITY): Payer: Self-pay

## 2016-02-06 DIAGNOSIS — R112 Nausea with vomiting, unspecified: Secondary | ICD-10-CM | POA: Insufficient documentation

## 2016-02-06 DIAGNOSIS — E860 Dehydration: Secondary | ICD-10-CM | POA: Insufficient documentation

## 2016-02-06 DIAGNOSIS — F17200 Nicotine dependence, unspecified, uncomplicated: Secondary | ICD-10-CM | POA: Insufficient documentation

## 2016-02-06 MED ORDER — PROMETHAZINE HCL 25 MG PO TABS: 25.0000 mg | ORAL_TABLET | Freq: Four times a day (QID) | ORAL | 0 refills | Status: AC | PRN

## 2016-02-06 MED ORDER — MORPHINE SULFATE (PF) 2 MG/ML IV SOLN: 2.0000 mg | Freq: Once | INTRAVENOUS | Status: DC

## 2016-02-06 MED ORDER — SODIUM CHLORIDE 0.9 % IV BOLUS (SEPSIS)
1000.0000 mL | Freq: Once | INTRAVENOUS | Status: AC
  Administered 2016-02-06: 1000 mL via INTRAVENOUS

## 2016-02-06 MED ORDER — ONDANSETRON HCL 4 MG/2ML IJ SOLN
4.0000 mg | Freq: Once | INTRAMUSCULAR | Status: AC
  Administered 2016-02-06: 4 mg via INTRAVENOUS
  Filled 2016-02-06: qty 2

## 2016-02-06 NOTE — ED Triage Notes (Signed)
Per Pt, Pt is coming from home with complaints of emesis that started last night. Pt had three episodes during the night with no associated diarrhea. Denies pain or nausea at this time. Has been able to keep down fluids since last occurrence. Denies any other symptoms at this time.

## 2016-02-06 NOTE — Discharge Instructions (Signed)
Prescription for nausea medication. Clear liquids today. Rest.

## 2016-02-06 NOTE — ED Provider Notes (Signed)
[facility]-EMER[facility]ENCY DEPT Provider Note   CSN: [id number] Arrival date & time: 02/06/16  0740     History   Chief Complaint Chief Complaint  Patient presents with  . Emesis    HPI Nathan Dixon is a 25 y.o. male.  Several episodes of nausea and vomiting since 2 AM this morning. Not keeping fluids down. He is normally healthy. Severity symptoms is mild to moderate. Nothing makes symptoms better or worse. No fever, sweats, chills.      History reviewed. No pertinent past medical history.  There are no active problems to display for this patient.   History reviewed. No pertinent surgical history.     Home Medications    Prior to Admission medications   Medication Sig Start Date End Date Taking? Authorizing Provider  acetaminophen (TYLENOL) 500 MG tablet Take 1,000 mg by mouth every 6 (six) hours as needed for moderate pain or headache.   Yes Historical Provider, MD  aspirin-sod bicarb-citric acid (ALKA-SELTZER) 325 MG TBEF tablet Take 325 mg by mouth every 6 (six) hours as needed (cold symptoms).   Yes Historical Provider, MD  naproxen (NAPROSYN) 375 MG tablet Take 1 tablet (375 mg total) by mouth 2 (two) times daily. 12/08/15  Yes Shaune Pollack, MD  cyclobenzaprine (FLEXERIL) 10 MG tablet Take 1 tablet (10 mg total) by mouth 3 (three) times daily as needed for muscle spasms. Patient not taking: Reported on 02/06/2016 12/08/15   Shaune Pollack, MD  promethazine (PHENERGAN) 25 MG tablet Take 1 tablet (25 mg total) by mouth every 6 (six) hours as needed for nausea or vomiting. 02/06/16   Donnetta Hutching, MD    Family History No family history on file.  Social History Social History  Substance Use Topics  . Smoking status: Current Every Day Smoker  . Smokeless tobacco: Never Used  . Alcohol use Yes     Comment: occ     Allergies   Review of patient's allergies indicates no known allergies.   Review of Systems Review of Systems  All other systems reviewed and are  negative.    Physical Exam Updated Vital Signs BP 96/60 (BP Location: Right Arm)   Pulse 94   Temp 98.2 F (36.8 C) (Oral)   Resp 18   Ht 5\' 11"  (1.803 m)   Wt 168 lb (76.2 kg)   SpO2 100%   BMI 23.43 kg/m   Physical Exam  Constitutional: He is oriented to person, place, and time.  Slightly dehydrated.  HENT:  Head: Normocephalic and atraumatic.  Eyes: Conjunctivae are normal.  Neck: Neck supple.  Cardiovascular: Normal rate and regular rhythm.   Pulmonary/Chest: Effort normal and breath sounds normal.  Abdominal: Soft. Bowel sounds are normal.  Musculoskeletal: Normal range of motion.  Neurological: He is alert and oriented to person, place, and time.  Skin: Skin is warm and dry.  Psychiatric: He has a normal mood and affect. His behavior is normal.  Nursing note and vitals reviewed.    ED Treatments / Results  Labs (all labs ordered are listed, but only abnormal results are displayed) Labs Reviewed - No data to display  EKG  EKG Interpretation None       Radiology No results found.  Procedures Procedures (including critical care time)  Medications Ordered in ED Medications  morphine 2 MG/ML injection 2 mg (0 mg Intravenous Hold 02/06/16 0807)  ondansetron (ZOFRAN) injection 4 mg (4 mg Intravenous Given 02/06/16 0806)  sodium chloride 0.9 % bolus 1,000 mL (1,000 mLs  Intravenous New Bag/Given 02/06/16 1116)  sodium chloride 0.9 % bolus 1,000 mL (0 mLs Intravenous Stopped 02/06/16 1116)     Initial Impression / Assessment and Plan / ED Course  I have reviewed the triage vital signs and the nursing notes.  Pertinent labs & imaging results that were available during my care of the patient were reviewed by me and considered in my medical decision making (see chart for details).  Clinical Course    No acute abdomen. Patient feels much better after IV fluids. Will discharge home with Phenergan 25 mg.  Final Clinical Impressions(s) / ED Diagnoses   Final  diagnoses:  Intractable vomiting with nausea, unspecified vomiting type    New Prescriptions New Prescriptions   PROMETHAZINE (PHENERGAN) 25 MG TABLET    Take 1 tablet (25 mg total) by mouth every 6 (six) hours as needed for nausea or vomiting.     Donnetta HutchingBrian Zygmund Passero, MD 02/06/16 [phone]

## 2016-02-10 ENCOUNTER — Encounter (HOSPITAL_COMMUNITY): Payer: Self-pay | Admitting: *Deleted

## 2016-02-10 ENCOUNTER — Emergency Department (HOSPITAL_COMMUNITY)
Admission: EM | Admit: 2016-02-10 | Discharge: 2016-02-10 | Disposition: A | Discharge status: Home / Self Care | Payer: Self-pay | Attending: Emergency Medicine | Admitting: Emergency Medicine

## 2016-02-10 DIAGNOSIS — F17200 Nicotine dependence, unspecified, uncomplicated: Secondary | ICD-10-CM | POA: Insufficient documentation

## 2016-02-10 DIAGNOSIS — Z7982 Long term (current) use of aspirin: Secondary | ICD-10-CM | POA: Insufficient documentation

## 2016-02-10 DIAGNOSIS — R109 Unspecified abdominal pain: Secondary | ICD-10-CM | POA: Insufficient documentation

## 2016-02-10 DIAGNOSIS — Z5321 Procedure and treatment not carried out due to patient leaving prior to being seen by health care provider: Secondary | ICD-10-CM | POA: Insufficient documentation

## 2016-02-10 LAB — CBC
HCT: 46.2 % (ref 39.0–52.0)
HEMOGLOBIN: 15.1 g/dL (ref 13.0–17.0)
MCH: 29.3 pg (ref 26.0–34.0)
MCHC: 32.7 g/dL (ref 30.0–36.0)
MCV: 89.5 fL (ref 78.0–100.0)
PLATELETS: 256 10*3/uL (ref 150–400)
RBC: 5.16 MIL/uL (ref 4.22–5.81)
RDW: 14.6 % (ref 11.5–15.5)
WBC: 7.3 10*3/uL (ref 4.0–10.5)

## 2016-02-10 LAB — COMPREHENSIVE METABOLIC PANEL
ALBUMIN: 3.9 g/dL (ref 3.5–5.0)
ALK PHOS: 45 U/L (ref 38–126)
ALT: 16 U/L — ABNORMAL LOW (ref 17–63)
ANION GAP: 9 (ref 5–15)
AST: 27 U/L (ref 15–41)
BILIRUBIN TOTAL: 0.9 mg/dL (ref 0.3–1.2)
BUN: 6 mg/dL (ref 6–20)
CALCIUM: 9.1 mg/dL (ref 8.9–10.3)
CO2: 27 mmol/L (ref 22–32)
Chloride: 105 mmol/L (ref 101–111)
Creatinine, Ser: 0.94 mg/dL (ref 0.61–1.24)
GFR calc Af Amer: >60 mL/min (ref >60)
GLUCOSE: 69 mg/dL (ref 65–99)
Potassium: 3.3 mmol/L — ABNORMAL LOW (ref 3.5–5.1)
Sodium: 141 mmol/L (ref 135–145)
TOTAL PROTEIN: 6 g/dL — AB (ref 6.5–8.1)

## 2016-02-10 LAB — LIPASE, BLOOD: Lipase: 24 U/L (ref 11–51)

## 2016-02-10 NOTE — ED Notes (Signed)
Pt called x 3  No answer. 

## 2016-02-10 NOTE — ED Triage Notes (Signed)
Pt was here on 10/2 for n/v and prescribed phenergan. Pt now reports intermittent sharp mid abd pains. Denies any vomiting or diarrhea, no acute distress noted at triage.

## 2016-02-14 ENCOUNTER — Emergency Department (HOSPITAL_COMMUNITY)
Admission: EM | Admit: 2016-02-14 | Discharge: 2016-02-14 | Disposition: A | Discharge status: Home / Self Care | Payer: Self-pay | Attending: Emergency Medicine | Admitting: Emergency Medicine

## 2016-02-14 ENCOUNTER — Encounter (HOSPITAL_COMMUNITY): Payer: Self-pay

## 2016-02-14 DIAGNOSIS — B353 Tinea pedis: Secondary | ICD-10-CM | POA: Insufficient documentation

## 2016-02-14 DIAGNOSIS — R1013 Epigastric pain: Secondary | ICD-10-CM | POA: Insufficient documentation

## 2016-02-14 DIAGNOSIS — Z79899 Other long term (current) drug therapy: Secondary | ICD-10-CM | POA: Insufficient documentation

## 2016-02-14 DIAGNOSIS — F17200 Nicotine dependence, unspecified, uncomplicated: Secondary | ICD-10-CM | POA: Insufficient documentation

## 2016-02-14 MED ORDER — CLOTRIMAZOLE 1 % EX CREA: TOPICAL_CREAM | CUTANEOUS | 0 refills | Status: AC

## 2016-02-14 MED ORDER — FAMOTIDINE 20 MG PO TABS: 20.0000 mg | ORAL_TABLET | Freq: Two times a day (BID) | ORAL | 0 refills | Status: AC

## 2016-02-14 NOTE — ED Notes (Signed)
Nurse is going  To collect the labs

## 2016-02-14 NOTE — ED Triage Notes (Signed)
Pt presents with c/o sharp generalized abdominal pain that started 2 days ago. Pt was seen for the same several times within the last 10 days. Pt denies N/V/D at this time.

## 2016-02-15 NOTE — ED Provider Notes (Signed)
[facility]-EMERGENCY DEPT Provider Note   CSN: [id number] Arrival date & time: 02/14/16  0704     History   Chief Complaint Chief Complaint  Patient presents with  . Abdominal Pain    HPI Nathan Dixon is a 25 y.o. male.  HPI   25 year old male with abdominal pain. Upper abdomen. Patient was recently seen in the emergency room for nausea and vomiting. He was prescribed Phenergan. He took 2 doses of this. Has nausea and vomiting has subsided but he still having intermittent upper abdominal pains. He questions whether this may be a side effect of Phenergan. No urinary complaints. No fevers or chills. No change in his bowel movements.  Additionally, he is complaining of itching and burning and cracking in the webspaces of his toes bilaterally.  History reviewed. No pertinent past medical history.  There are no active problems to display for this patient.   History reviewed. No pertinent surgical history.     Home Medications    Prior to Admission medications   Medication Sig Start Date End Date Taking? Authorizing Provider  acetaminophen (TYLENOL) 500 MG tablet Take 1,000 mg by mouth every 6 (six) hours as needed for moderate pain or headache.    Historical Provider, MD  aspirin-sod bicarb-citric acid (ALKA-SELTZER) 325 MG TBEF tablet Take 325 mg by mouth every 6 (six) hours as needed (cold symptoms).    Historical Provider, MD  clotrimazole (LOTRIMIN) 1 % cream Apply to affected area 2 times daily 02/14/16   Raeford RazorStephen Kazmir Oki, MD  cyclobenzaprine (FLEXERIL) 10 MG tablet Take 1 tablet (10 mg total) by mouth 3 (three) times daily as needed for muscle spasms. Patient not taking: Reported on 02/06/2016 12/08/15   Shaune Pollackameron Isaacs, MD  famotidine (PEPCID) 20 MG tablet Take 1 tablet (20 mg total) by mouth 2 (two) times daily. 02/14/16   Raeford RazorStephen Trevis Eden, MD  naproxen (NAPROSYN) 375 MG tablet Take 1 tablet (375 mg total) by mouth 2 (two) times daily. 12/08/15   Shaune Pollackameron Isaacs, MD  promethazine  (PHENERGAN) 25 MG tablet Take 1 tablet (25 mg total) by mouth every 6 (six) hours as needed for nausea or vomiting. 02/06/16   Donnetta HutchingBrian Cook, MD    Family History No family history on file.  Social History Social History  Substance Use Topics  . Smoking status: Current Every Day Smoker  . Smokeless tobacco: Never Used  . Alcohol use Yes     Comment: occ     Allergies   Review of patient's allergies indicates no known allergies.   Review of Systems Review of Systems   Physical Exam Updated Vital Signs BP 110/89 (BP Location: Left Arm)   Pulse 87   Temp 97.9 F (36.6 C) (Oral)   Resp 18   Ht 5\' 11"  (1.803 m)   Wt 169 lb (76.7 kg)   SpO2 100%   BMI 23.57 kg/m   Physical Exam  Constitutional: He appears well-developed and well-nourished. No distress.  HENT:  Head: Normocephalic and atraumatic.  Eyes: Conjunctivae are normal. Right eye exhibits no discharge. Left eye exhibits no discharge.  Neck: Neck supple.  Cardiovascular: Normal rate, regular rhythm and normal heart sounds.  Exam reveals no gallop and no friction rub.   No murmur heard. Pulmonary/Chest: Effort normal and breath sounds normal. No respiratory distress.  Abdominal: Soft. He exhibits no distension. There is no tenderness.  Musculoskeletal: He exhibits no edema or tenderness.  Macerated appearance and skin changes consistent with athlete's foot bilateral feet. No cellulitis.  Neurological: He is alert.  Skin: Skin is warm and dry.  Psychiatric: He has a normal mood and affect. His behavior is normal. Thought content normal.  Nursing note and vitals reviewed.    ED Treatments / Results  Labs (all labs ordered are listed, but only abnormal results are displayed) Labs Reviewed - No data to display  EKG  EKG Interpretation None       Radiology No results found.  Procedures Procedures (including critical care time)  Medications Ordered in ED Medications - No data to display   Initial  Impression / Assessment and Plan / ED Course  I have reviewed the triage vital signs and the nursing notes.  Pertinent labs & imaging results that were available during my care of the patient were reviewed by me and considered in my medical decision making (see chart for details).  Clinical Course    25 year old male with epigastric pain. The stomach exam is benign. Is afebrile. Well-appearing. He dynamically stable. We'll put him on a trial of the H2 blocker for possible gastritis/GERD. Also topical antifungals her athlete's foot. He is to establish a PCP.  Final Clinical Impressions(s) / ED Diagnoses   Final diagnoses:  Epigastric pain  Tinea pedis, unspecified laterality    New Prescriptions Discharge Medication List as of 02/14/2016  8:41 AM    START taking these medications   Details  clotrimazole (LOTRIMIN) 1 % cream Apply to affected area 2 times daily, Print    famotidine (PEPCID) 20 MG tablet Take 1 tablet (20 mg total) by mouth 2 (two) times daily., Starting Tue 02/14/2016, Print         Raeford Razor, MD 02/15/16 1344

## 2016-02-24 ENCOUNTER — Emergency Department (HOSPITAL_COMMUNITY)
Admission: EM | Admit: 2016-02-24 | Discharge: 2016-02-24 | Disposition: A | Discharge status: Home / Self Care | Payer: Self-pay | Attending: Dermatology | Admitting: Dermatology

## 2016-02-24 ENCOUNTER — Encounter (HOSPITAL_COMMUNITY): Payer: Self-pay

## 2016-02-24 ENCOUNTER — Encounter (HOSPITAL_COMMUNITY): Payer: Self-pay | Admitting: *Deleted

## 2016-02-24 ENCOUNTER — Emergency Department (HOSPITAL_COMMUNITY)
Admission: EM | Admit: 2016-02-24 | Discharge: 2016-02-24 | Disposition: A | Discharge status: Home / Self Care | Payer: Self-pay | Attending: Emergency Medicine | Admitting: Emergency Medicine

## 2016-02-24 DIAGNOSIS — Z79899 Other long term (current) drug therapy: Secondary | ICD-10-CM | POA: Insufficient documentation

## 2016-02-24 DIAGNOSIS — L0231 Cutaneous abscess of buttock: Secondary | ICD-10-CM | POA: Insufficient documentation

## 2016-02-24 DIAGNOSIS — Z7982 Long term (current) use of aspirin: Secondary | ICD-10-CM | POA: Insufficient documentation

## 2016-02-24 DIAGNOSIS — F17200 Nicotine dependence, unspecified, uncomplicated: Secondary | ICD-10-CM | POA: Insufficient documentation

## 2016-02-24 DIAGNOSIS — L0291 Cutaneous abscess, unspecified: Secondary | ICD-10-CM

## 2016-02-24 DIAGNOSIS — Z5321 Procedure and treatment not carried out due to patient leaving prior to being seen by health care provider: Secondary | ICD-10-CM | POA: Insufficient documentation

## 2016-02-24 MED ORDER — DOXYCYCLINE HYCLATE 100 MG PO CAPS: 100.0000 mg | ORAL_CAPSULE | Freq: Two times a day (BID) | ORAL | 0 refills | Status: AC

## 2016-02-24 MED ORDER — HYDROCODONE-ACETAMINOPHEN 5-325 MG PO TABS: 1.0000 | ORAL_TABLET | Freq: Four times a day (QID) | ORAL | 0 refills | Status: AC | PRN

## 2016-02-24 MED ORDER — LIDOCAINE-EPINEPHRINE 1 %-1:100000 IJ SOLN
10.0000 mL | Freq: Once | INTRAMUSCULAR | Status: AC
  Administered 2016-02-24: 10 mL
  Filled 2016-02-24: qty 10
  Filled 2016-02-24: qty 1

## 2016-02-24 NOTE — ED Triage Notes (Signed)
Per Pt, Pt is coming from home with abscess to the right buttocks that started two days ago. Pt reports pain with sitting. Denies fevers.

## 2016-02-24 NOTE — ED Notes (Signed)
Unable to locate pt to take back to an exam room

## 2016-02-24 NOTE — Discharge Instructions (Signed)
Return here to have packing removed.  Use warm compresses around the area.  Keep area clean and dry.  Once the packing is removed you can use warm bath soaks

## 2016-02-24 NOTE — ED Notes (Signed)
C/O "BOIL" ON BUTTOCK X 2 DAYS. HX OF FREQUENT BOILS.

## 2016-02-24 NOTE — ED Triage Notes (Signed)
Pt c/o abscess x 2 days on buttocks. Unable to sit down without increased pain

## 2016-02-24 NOTE — ED Provider Notes (Signed)
[facility]-EMERGENCY DEPT Provider Note  By signing my name below, I, Earmon Phoenix, attest that this documentation has been prepared under the direction and in the presence of [employer], PA-C. Electronically Signed: Earmon Phoenix, ED Scribe. 02/24/16. 10:58 AM.     History   Chief Complaint Chief Complaint  Patient presents with  . Abscess    The history is provided by the patient and medical records. No language interpreter was used.    HPI Comments:  Nathan Dixon is a 25 y.o. male who presents to the Emergency Department complaining of an abscess to the right buttocks near the gluteal cleft that appeared about two days ago. He reports associated drainage and moderate pain. He has not done anything to treat the symptoms. Touching the area and sitting increases the pain. He denies alleviating factors. He denies fever, chills, nausea, vomiting, red streaking or warmth of the area.   History reviewed. No pertinent past medical history.  There are no active problems to display for this patient.   History reviewed. No pertinent surgical history.     Home Medications    Prior to Admission medications   Medication Sig Start Date End Date Taking? Authorizing Provider  acetaminophen (TYLENOL) 500 MG tablet Take 1,000 mg by mouth every 6 (six) hours as needed for moderate pain or headache.    Historical Provider, MD  aspirin-sod bicarb-citric acid (ALKA-SELTZER) 325 MG TBEF tablet Take 325 mg by mouth every 6 (six) hours as needed (cold symptoms).    Historical Provider, MD  clotrimazole (LOTRIMIN) 1 % cream Apply to affected area 2 times daily 02/14/16   Raeford Razor, MD  cyclobenzaprine (FLEXERIL) 10 MG tablet Take 1 tablet (10 mg total) by mouth 3 (three) times daily as needed for muscle spasms. Patient not taking: Reported on 02/06/2016 12/08/15   Shaune Pollack, MD  famotidine (PEPCID) 20 MG tablet Take 1 tablet (20 mg total) by mouth 2 (two) times daily. 02/14/16   Raeford Razor, MD  naproxen (NAPROSYN) 375 MG tablet Take 1 tablet (375 mg total) by mouth 2 (two) times daily. 12/08/15   Shaune Pollack, MD  promethazine (PHENERGAN) 25 MG tablet Take 1 tablet (25 mg total) by mouth every 6 (six) hours as needed for nausea or vomiting. 02/06/16   Donnetta Hutching, MD    Family History No family history on file.  Social History Social History  Substance Use Topics  . Smoking status: Current Every Day Smoker  . Smokeless tobacco: Never Used  . Alcohol use Yes     Comment: occ     Allergies   Review of patient's allergies indicates no known allergies.   Review of Systems Review of Systems  Constitutional: Negative for chills and fever.  Gastrointestinal: Negative for nausea and vomiting.  Skin: Positive for color change.     Physical Exam Updated Vital Signs BP 119/73 (BP Location: Left Arm)   Pulse 103   Temp 98.1 F (36.7 C) (Oral)   Resp 14   Ht 5\' 11"  (1.803 m)   Wt 169 lb (76.7 kg)   SpO2 98%   BMI 23.57 kg/m   Physical Exam  Constitutional: He is oriented to person, place, and time. He appears well-developed and well-nourished.  HENT:  Head: Normocephalic and atraumatic.  Neck: Normal range of motion.  Cardiovascular: Normal rate.   Pulmonary/Chest: Effort normal.  Musculoskeletal: Normal range of motion.  Neurological: He is alert and oriented to person, place, and time.  Skin: Skin is warm  and dry. There is erythema.  Abscess at the superior medial gluteal fold draining bloody, purulent material with some surrounding erythema.  Psychiatric: He has a normal mood and affect. His behavior is normal.  Nursing note and vitals reviewed.    ED Treatments / Results  DIAGNOSTIC STUDIES: Oxygen Saturation is 98% on RA, normal by my interpretation.   COORDINATION OF CARE: 10:02 AM- Will I and D the area. Pt verbalizes understanding and agrees to plan.  Medications  lidocaine-EPINEPHrine (XYLOCAINE W/EPI) 1 %-1:100000 (with pres)  injection 10 mL (10 mLs Infiltration Given by Other 02/24/16 0956)    Labs (all labs ordered are listed, but only abnormal results are displayed) Labs Reviewed - No data to display  EKG  EKG Interpretation None       Radiology No results found.  Procedures Procedures (including critical care time) INCISION AND DRAINAGE PROCEDURE NOTE: Patient identification was confirmed and verbal consent was obtained. This procedure was performed by Ebbie Ridgehris Syriah Delisi, PA-C at 10:47 AM. Site: right buttock Sterile procedures observed Needle size: 25 G Anesthetic used (type and amt): Lidocaine 2%  (5 mLs) Blade size: 11 Drainage: moderate Complexity: Complex Packing used: Iodoform 1/4" Site anesthetized, incision made over site, wound drained and explored loculations, rinsed with copious amounts of normal saline, covered with dry, sterile dressing. Pt tolerated procedure well without complications. Instructions for care discussed verbally and pt provided with additional written instructions for homecare and f/u.   Medications Ordered in ED Medications  lidocaine-EPINEPHrine (XYLOCAINE W/EPI) 1 %-1:100000 (with pres) injection 10 mL (10 mLs Infiltration Given by Other 02/24/16 [id number])     Initial Impression / Assessment and Plan / ED Course  I have reviewed the triage vital signs and the nursing notes.  Pertinent labs & imaging results that were available during my care of the patient were reviewed by me and considered in my medical decision making (see chart for details).  Clinical Course    Patient will be treated with antibiotics.  Told to return here as needed.  Patient is advised the plan and all questions were answered  Final Clinical Impressions(s) / ED Diagnoses   Final diagnoses:  None    New Prescriptions New Prescriptions   No medications on file   I personally performed the services described in this documentation, which was scribed in my presence. The recorded  information has been reviewed and is accurate.     Charlestine NightChristopher Dashiell Franchino, PA-C 02/28/16 2222    Laurence Spatesachel Morgan Little, MD 02/29/16 [phone]

## 2016-05-25 ENCOUNTER — Encounter (HOSPITAL_COMMUNITY): Payer: Self-pay

## 2016-05-25 ENCOUNTER — Emergency Department (HOSPITAL_COMMUNITY)
Admission: EM | Admit: 2016-05-25 | Discharge: 2016-05-25 | Disposition: A | Discharge status: Home / Self Care | Payer: Self-pay | Attending: Emergency Medicine | Admitting: Emergency Medicine

## 2016-05-25 DIAGNOSIS — J0100 Acute maxillary sinusitis, unspecified: Secondary | ICD-10-CM | POA: Insufficient documentation

## 2016-05-25 DIAGNOSIS — Z7982 Long term (current) use of aspirin: Secondary | ICD-10-CM | POA: Insufficient documentation

## 2016-05-25 DIAGNOSIS — F17200 Nicotine dependence, unspecified, uncomplicated: Secondary | ICD-10-CM | POA: Insufficient documentation

## 2016-05-25 MED ORDER — PSEUDOEPHEDRINE HCL 30 MG PO TABS: 30.0000 mg | ORAL_TABLET | Freq: Four times a day (QID) | ORAL | 0 refills | Status: AC | PRN

## 2016-05-25 MED ORDER — AMOXICILLIN 500 MG PO CAPS: 500.0000 mg | ORAL_CAPSULE | Freq: Three times a day (TID) | ORAL | 0 refills | Status: AC

## 2016-05-25 NOTE — ED Triage Notes (Signed)
Pt. Developed nasal congestion 2 days ago and now is having a head pressure and cough. Pt. Denies any chills, fever, n./v/d or sore throat

## 2016-05-25 NOTE — ED Provider Notes (Signed)
[facility] Provider Note   CSN: [id number] Arrival date & time: 05/25/16  1619   By signing my name below, I, Clovis Pu, attest that this documentation has been prepared under the direction and in the presence of  Kerrie Buffalo, NP. Electronically Signed: Clovis Pu, ED Scribe. 05/25/16. 6:15 PM.   History   Chief Complaint Chief Complaint  Patient presents with  . Nasal Congestion  . Headache   The history is provided by the patient. No language interpreter was used.  Headache   This is a new problem. The current episode started 2 days ago. The problem has not changed since onset.Associated with: sinus pressure. The pain is moderate. Pertinent negatives include no fever and no chest pressure. Treatments tried: Dayquil. The treatment provided no relief.   HPI Comments:  Nathan Dixon is a 26 y.o. male who presents to the Emergency Department complaining of persistent sinus pressure and nasal congestion x 2 days. Pt reports an associated headache secondary to sinus pressure, postnasal drainage and a mild cough. States the facial pain makes it feel like a tooth ache but is not. He has taken Dayquil with no relief. Pt denies sore throat, fevers, chills, chest pain, abdominal pain and any other associated symptoms at this time.   History reviewed. No pertinent past medical history.  There are no active problems to display for this patient.   History reviewed. No pertinent surgical history.   Home Medications    Prior to Admission medications   Medication Sig Start Date End Date Taking? Authorizing Provider  acetaminophen (TYLENOL) 500 MG tablet Take 1,000 mg by mouth every 6 (six) hours as needed for moderate pain or headache.    Historical Provider, MD  amoxicillin (AMOXIL) 500 MG capsule Take 1 capsule (500 mg total) by mouth 3 (three) times daily. 05/25/16   Larah Kuntzman Orlene Och, NP  aspirin-sod bicarb-citric acid (ALKA-SELTZER) 325 MG TBEF tablet Take 325 mg by mouth  every 6 (six) hours as needed (cold symptoms).    Historical Provider, MD  clotrimazole (LOTRIMIN) 1 % cream Apply to affected area 2 times daily 02/14/16   Raeford Razor, MD  cyclobenzaprine (FLEXERIL) 10 MG tablet Take 1 tablet (10 mg total) by mouth 3 (three) times daily as needed for muscle spasms. Patient not taking: Reported on 02/06/2016 12/08/15   Shaune Pollack, MD  doxycycline (VIBRAMYCIN) 100 MG capsule Take 1 capsule (100 mg total) by mouth 2 (two) times daily. 02/24/16   Charlestine Night, PA-C  famotidine (PEPCID) 20 MG tablet Take 1 tablet (20 mg total) by mouth 2 (two) times daily. 02/14/16   Raeford Razor, MD  HYDROcodone-acetaminophen (NORCO/VICODIN) 5-325 MG tablet Take 1 tablet by mouth every 6 (six) hours as needed for moderate pain. 02/24/16   Charlestine Night, PA-C  naproxen (NAPROSYN) 375 MG tablet Take 1 tablet (375 mg total) by mouth 2 (two) times daily. 12/08/15   Shaune Pollack, MD  promethazine (PHENERGAN) 25 MG tablet Take 1 tablet (25 mg total) by mouth every 6 (six) hours as needed for nausea or vomiting. 02/06/16   Donnetta Hutching, MD  pseudoephedrine (SUDAFED) 30 MG tablet Take 1 tablet (30 mg total) by mouth every 6 (six) hours as needed for congestion. 05/25/16   Vangie Henthorn Orlene Och, NP    Family History No family history on file.  Social History Social History  Substance Use Topics  . Smoking status: Current Every Day Smoker  . Smokeless tobacco: Never Used  . Alcohol use Yes  Comment: occ     Allergies   Patient has no known allergies.   Review of Systems Review of Systems  Constitutional: Negative for fever.  HENT: Positive for congestion, postnasal drip and sinus pressure. Negative for sore throat.   Respiratory: Positive for cough.   Cardiovascular: Negative for chest pain.  Neurological: Positive for headaches.    Physical Exam Updated Vital Signs BP 121/67 (BP Location: Right Arm)   Pulse 89   Temp 98.9 F (37.2 C) (Oral)   Resp 14   Ht 5'  11" (1.803 m)   Wt 72.4 kg   SpO2 99%   BMI 22.25 kg/m   Physical Exam  Constitutional: He is oriented to person, place, and time. He appears well-developed and well-nourished. No distress.  HENT:  Head: Normocephalic and atraumatic.  Right Ear: Tympanic membrane normal.  Left Ear: Tympanic membrane normal.  Mouth/Throat: Uvula is midline. Posterior oropharyngeal erythema present. No posterior oropharyngeal edema.  Maxillary sinus tenderness   Eyes: Conjunctivae and EOM are normal. Pupils are equal, round, and reactive to light.  Sclera clear  Cardiovascular: Normal rate, regular rhythm and normal heart sounds.   Pulmonary/Chest: Effort normal and breath sounds normal. He exhibits no tenderness.  Abdominal: Soft. He exhibits no distension. There is no tenderness.  No CVA tenderness   Lymphadenopathy:    He has no cervical adenopathy.  Neurological: He is alert and oriented to person, place, and time.  Skin: Skin is warm and dry.  Psychiatric: He has a normal mood and affect.  Nursing note and vitals reviewed.   ED Treatments / Results  DIAGNOSTIC STUDIES:  Oxygen Saturation is 99% on RA, normal by my interpretation.    COORDINATION OF CARE:  6:13 PM Discussed treatment plan with pt at bedside and pt agreed to plan.  Labs (all labs ordered are listed, but only abnormal results are displayed) Labs Reviewed - No data to display   Radiology No results found.  Procedures Procedures (including critical care time)  Medications Ordered in ED Medications - No data to display   Initial Impression / Assessment and Plan / ED Course  I have reviewed the triage vital signs and the nursing notes.    Pt symptoms consistent with Sinusitis.  Pt will be discharged with amoxicillin and sudafed. Discussed return precautions.  Pt is hemodynamically stable & in NAD prior to discharge.  Final Clinical Impressions(s) / ED Diagnoses   Final diagnoses:  Acute maxillary sinusitis,  recurrence not specified    New Prescriptions Discharge Medication List as of 05/25/2016  6:14 PM    START taking these medications   Details  amoxicillin (AMOXIL) 500 MG capsule Take 1 capsule (500 mg total) by mouth 3 (three) times daily., Starting Fri 05/25/2016, Print    pseudoephedrine (SUDAFED) 30 MG tablet Take 1 tablet (30 mg total) by mouth every 6 (six) hours as needed for congestion., Starting Fri 05/25/2016, Print      I personally performed the services described in this documentation, which was scribed in my presence. The recorded information has been reviewed and is accurate.     [address]Hope NelsonM Laylamarie Meuser, NP 05/26/16 [id number]    Margarita Grizzleanielle Ray, MD 05/30/16 [phone]

## 2016-06-24 ENCOUNTER — Emergency Department (HOSPITAL_COMMUNITY)
Admission: EM | Admit: 2016-06-24 | Discharge: 2016-06-24 | Disposition: A | Discharge status: Home / Self Care | Payer: Self-pay | Attending: Emergency Medicine | Admitting: Emergency Medicine

## 2016-06-24 ENCOUNTER — Encounter (HOSPITAL_COMMUNITY): Payer: Self-pay

## 2016-06-24 DIAGNOSIS — Z23 Encounter for immunization: Secondary | ICD-10-CM | POA: Insufficient documentation

## 2016-06-24 DIAGNOSIS — L0231 Cutaneous abscess of buttock: Secondary | ICD-10-CM | POA: Insufficient documentation

## 2016-06-24 DIAGNOSIS — Z79899 Other long term (current) drug therapy: Secondary | ICD-10-CM | POA: Insufficient documentation

## 2016-06-24 DIAGNOSIS — Z7982 Long term (current) use of aspirin: Secondary | ICD-10-CM | POA: Insufficient documentation

## 2016-06-24 DIAGNOSIS — Z87891 Personal history of nicotine dependence: Secondary | ICD-10-CM | POA: Insufficient documentation

## 2016-06-24 MED ORDER — TETANUS-DIPHTH-ACELL PERTUSSIS 5-2.5-18.5 LF-MCG/0.5 IM SUSP
0.5000 mL | Freq: Once | INTRAMUSCULAR | Status: AC
  Administered 2016-06-24: 0.5 mL via INTRAMUSCULAR
  Filled 2016-06-24: qty 0.5

## 2016-06-24 MED ORDER — LIDOCAINE HCL (PF) 1 % IJ SOLN
5.0000 mL | Freq: Once | INTRAMUSCULAR | Status: AC
  Administered 2016-06-24: 5 mL
  Filled 2016-06-24: qty 5

## 2016-06-24 MED ORDER — SULFAMETHOXAZOLE-TRIMETHOPRIM 800-160 MG PO TABS
1.0000 | ORAL_TABLET | Freq: Once | ORAL | Status: AC
  Administered 2016-06-24: 1 via ORAL
  Filled 2016-06-24: qty 1

## 2016-06-24 MED ORDER — CEPHALEXIN 500 MG PO CAPS: 500.0000 mg | ORAL_CAPSULE | Freq: Four times a day (QID) | ORAL | 0 refills | Status: AC

## 2016-06-24 MED ORDER — HYDROCODONE-ACETAMINOPHEN 5-325 MG PO TABS: 1.0000 | ORAL_TABLET | Freq: Once | ORAL | Status: DC

## 2016-06-24 MED ORDER — NAPROXEN 375 MG PO TABS: 375.0000 mg | ORAL_TABLET | Freq: Two times a day (BID) | ORAL | 0 refills | Status: AC

## 2016-06-24 MED ORDER — CEPHALEXIN 250 MG PO CAPS
500.0000 mg | ORAL_CAPSULE | Freq: Once | ORAL | Status: AC
  Administered 2016-06-24: 500 mg via ORAL
  Filled 2016-06-24: qty 2

## 2016-06-24 MED ORDER — SULFAMETHOXAZOLE-TRIMETHOPRIM 800-160 MG PO TABS: 1.0000 | ORAL_TABLET | Freq: Two times a day (BID) | ORAL | 0 refills | Status: AC

## 2016-06-24 MED ORDER — OXYCODONE-ACETAMINOPHEN 5-325 MG PO TABS
1.0000 | ORAL_TABLET | Freq: Once | ORAL | Status: AC
  Administered 2016-06-24: 1 via ORAL
  Filled 2016-06-24: qty 1

## 2016-06-24 NOTE — ED Notes (Signed)
Pt presents with an abscess to the inside of his butt crack. Pt states it has been there for several days and he has had to have 2 drained before.

## 2016-06-24 NOTE — ED Triage Notes (Signed)
Pt states she started feeling pressure on buttock from abscess; pt states painful to sit down; Pt c/o pain 8/10; pt a&o x 4 in arrival. Pt denies any other sx.

## 2016-06-24 NOTE — ED Provider Notes (Signed)
[facility]-EMERGENCY DEPT Provider Note    By signing my name below, I, Earmon PhoenixJennifer Waddell, attest that this documentation has been prepared under the direction and in the presence of [facility], [place]. Electronically Signed: Earmon PhoenixJennifer Waddell, ED Scribe. 06/24/16. 10:41 PM.    History   Chief Complaint Chief Complaint  Patient presents with  . Abscess    The history is provided by the patient and medical records. No language interpreter was used.    Nathan Dixon is a 26 y.o. male who presents to the Emergency Department complaining of an abscess to the right inner buttock that appeared two days ago. He reports associated pain and pressure. He has been soaking in warm water for treatment. Sitting directly on the buttocks increases the pain. As long as there is no pressure to the area he does not experience pain. He denies fever, chills, nausea, vomiting, difficulty or pain with bowel movements, red streaking or warmth. He reports having abscesses in the past.    History reviewed. No pertinent past medical history.  There are no active problems to display for this patient.   History reviewed. No pertinent surgical history.     Home Medications    Prior to Admission medications   Medication Sig Start Date End Date Taking? Authorizing Provider  acetaminophen (TYLENOL) 500 MG tablet Take 1,000 mg by mouth every 6 (six) hours as needed for moderate pain or headache.    Historical Provider, MD  amoxicillin (AMOXIL) 500 MG capsule Take 1 capsule (500 mg total) by mouth 3 (three) times daily. 05/25/16   Hope Orlene OchM Neese, NP  aspirin-sod bicarb-citric acid (ALKA-SELTZER) 325 MG TBEF tablet Take 325 mg by mouth every 6 (six) hours as needed (cold symptoms).    Historical Provider, MD  cephALEXin (KEFLEX) 500 MG capsule Take 1 capsule (500 mg total) by mouth 4 (four) times daily. 06/24/16   Hope Orlene OchM Neese, NP  clotrimazole (LOTRIMIN) 1 % cream Apply to affected area 2 times daily 02/14/16   Raeford RazorStephen  Kohut, MD  cyclobenzaprine (FLEXERIL) 10 MG tablet Take 1 tablet (10 mg total) by mouth 3 (three) times daily as needed for muscle spasms. Patient not taking: Reported on 02/06/2016 12/08/15   Shaune Pollackameron Isaacs, MD  doxycycline (VIBRAMYCIN) 100 MG capsule Take 1 capsule (100 mg total) by mouth 2 (two) times daily. 02/24/16   Charlestine Nighthristopher Lawyer, PA-C  famotidine (PEPCID) 20 MG tablet Take 1 tablet (20 mg total) by mouth 2 (two) times daily. 02/14/16   Raeford RazorStephen Kohut, MD  HYDROcodone-acetaminophen (NORCO/VICODIN) 5-325 MG tablet Take 1 tablet by mouth every 6 (six) hours as needed for moderate pain. 02/24/16   Charlestine Nighthristopher Lawyer, PA-C  naproxen (NAPROSYN) 375 MG tablet Take 1 tablet (375 mg total) by mouth 2 (two) times daily. 06/24/16   Hope Orlene OchM Neese, NP  promethazine (PHENERGAN) 25 MG tablet Take 1 tablet (25 mg total) by mouth every 6 (six) hours as needed for nausea or vomiting. 02/06/16   Donnetta HutchingBrian Cook, MD  pseudoephedrine (SUDAFED) 30 MG tablet Take 1 tablet (30 mg total) by mouth every 6 (six) hours as needed for congestion. 05/25/16   Hope Orlene OchM Neese, NP  sulfamethoxazole-trimethoprim (BACTRIM DS,SEPTRA DS) 800-160 MG tablet Take 1 tablet by mouth 2 (two) times daily. 06/24/16 07/01/16  Hope Orlene OchM Neese, NP    Family History No family history on file.  Social History Social History  Substance Use Topics  . Smoking status: Former Smoker    Types: Cigarettes    Quit date: 06/18/2016  .  Smokeless tobacco: Never Used  . Alcohol use Yes     Comment: occ     Allergies   Patient has no known allergies.   Review of Systems Review of Systems  Constitutional: Negative for chills and fever.  Gastrointestinal: Negative for nausea and vomiting.  Skin: Positive for color change (abscess of buttocks). Negative for rash.     Physical Exam Updated Vital Signs BP 108/68 (BP Location: Left Arm)   Pulse 69   Temp 98.2 F (36.8 C) (Oral)   Resp 18   Ht 5\' 11"  (1.803 m)   Wt 159 lb (72.1 kg)   SpO2 99%    BMI 22.18 kg/m   Physical Exam  Constitutional: He is oriented to person, place, and time. He appears well-developed and well-nourished. No distress.  HENT:  Head: Normocephalic.  Eyes: EOM are normal.  Neck: Neck supple.  Cardiovascular: Normal rate.   Pulmonary/Chest: Effort normal.  Genitourinary: Rectal exam shows no mass.  Musculoskeletal: Normal range of motion.  Neurological: He is alert and oriented to person, place, and time. No cranial nerve deficit.  Skin: Skin is warm and dry.  5 cm raised, tender, fluctuant area to the right buttock. Does not extend into anal area.  Psychiatric: He has a normal mood and affect. His behavior is normal.  Nursing note and vitals reviewed.    ED Treatments / Results  DIAGNOSTIC STUDIES: Oxygen Saturation is 99% on RA, normal by my interpretation.   COORDINATION OF CARE: 9:37 PM- Will I & D abscess and order pain medication prior to procedure. Pt verbalizes understanding and agrees to plan.  INCISION AND DRAINAGE PROCEDURE NOTE: Patient identification was confirmed and verbal consent was obtained. This procedure was performed by Kerrie Buffalo, FNP at 10:28 PM. Site: right buttock Sterile procedures observed Needle size: 25 G Anesthetic used (type and amt): Lidocaine 1% without Epinephrine (4 mLs) Blade size: 11 Drainage: small amount of purulent drainage Complexity: Complex Packing used: 1/4" Iodoform Site anesthetized, incision made over site, wound drained and explored loculations, rinsed with copious amounts of normal saline, covered with dry, sterile dressing. Pt tolerated procedure well without complications. Instructions for care discussed verbally and pt provided with additional written instructions for homecare and f/u.   Medications  lidocaine (PF) (XYLOCAINE) 1 % injection 5 mL (5 mLs Infiltration Given 06/24/16 2156)  Tdap (BOOSTRIX) injection 0.5 mL (0.5 mLs Intramuscular Given 06/24/16 2155)  sulfamethoxazole-trimethoprim  (BACTRIM DS,SEPTRA DS) 800-160 MG per tablet 1 tablet (1 tablet Oral Given 06/24/16 2257)  cephALEXin (KEFLEX) capsule 500 mg (500 mg Oral Given 06/24/16 2257)  oxyCODONE-acetaminophen (PERCOCET/ROXICET) 5-325 MG per tablet 1 tablet (1 tablet Oral Given 06/24/16 2258)    Radiology No results found.  Procedures Procedures (including critical care time)  Medications Ordered in ED Medications  lidocaine (PF) (XYLOCAINE) 1 % injection 5 mL (5 mLs Infiltration Given 06/24/16 2156)  Tdap (BOOSTRIX) injection 0.5 mL (0.5 mLs Intramuscular Given 06/24/16 2155)  sulfamethoxazole-trimethoprim (BACTRIM DS,SEPTRA DS) 800-160 MG per tablet 1 tablet (1 tablet Oral Given 06/24/16 2257)  cephALEXin (KEFLEX) capsule 500 mg (500 mg Oral Given 06/24/16 2257)  oxyCODONE-acetaminophen (PERCOCET/ROXICET) 5-325 MG per tablet 1 tablet (1 tablet Oral Given 06/24/16 2258)     Initial Impression / Assessment and Plan / ED Course  I have reviewed the triage vital signs and the nursing notes.  Patient with skin abscess to the right inner buttock. Incision and drainage performed in the ED today.  Abscess was packed with 1/4"  Iodoform packing. Wound recheck and packing removal in 2 days. Supportive care and return precautions discussed. Pt sent home with Bactrim and Keflex. The patient appears reasonably screened and/or stabilized for discharge and I doubt any other emergent medical condition requiring further screening, evaluation, or treatment in the ED prior to discharge. I personally performed the services described in this documentation, which was scribed in my presence. The recorded information has been reviewed and is accurate.   Final Clinical Impressions(s) / ED Diagnoses   Final diagnoses:  Abscess of buttock, right    New Prescriptions Discharge Medication List as of 06/24/2016 10:43 PM    START taking these medications   Details  cephALEXin (KEFLEX) 500 MG capsule Take 1 capsule (500 mg total) by mouth 4  (four) times daily., Starting Sun 06/24/2016, Print    sulfamethoxazole-trimethoprim (BACTRIM DS,SEPTRA DS) 800-160 MG tablet Take 1 tablet by mouth 2 (two) times daily., Starting Sun 06/24/2016, Until Sun 07/01/2016, Print         [place], NP 06/26/16 1610    Linwood Dibbles, MD 06/28/16 2105

## 2016-06-24 NOTE — Discharge Instructions (Signed)
Follow up in 2 days for wound check and packing removal. Return sooner for any problems.  °

## 2017-04-01 ENCOUNTER — Encounter (HOSPITAL_COMMUNITY): Payer: Self-pay | Admitting: Emergency Medicine

## 2017-04-01 ENCOUNTER — Emergency Department (HOSPITAL_COMMUNITY)
Admission: EM | Admit: 2017-04-01 | Discharge: 2017-04-01 | Disposition: A | Discharge status: Home / Self Care | Payer: Self-pay | Attending: Emergency Medicine | Admitting: Emergency Medicine

## 2017-04-01 ENCOUNTER — Emergency Department (HOSPITAL_COMMUNITY): Payer: Self-pay

## 2017-04-01 DIAGNOSIS — Z87891 Personal history of nicotine dependence: Secondary | ICD-10-CM | POA: Insufficient documentation

## 2017-04-01 DIAGNOSIS — M25552 Pain in left hip: Secondary | ICD-10-CM | POA: Insufficient documentation

## 2017-04-01 DIAGNOSIS — Z79899 Other long term (current) drug therapy: Secondary | ICD-10-CM | POA: Insufficient documentation

## 2017-04-01 MED ORDER — CYCLOBENZAPRINE HCL 10 MG PO TABS: 10.0000 mg | ORAL_TABLET | Freq: Two times a day (BID) | ORAL | 0 refills | Status: AC | PRN

## 2017-04-01 MED ORDER — DICLOFENAC SODIUM 50 MG PO TBEC
50.0000 mg | DELAYED_RELEASE_TABLET | Freq: Two times a day (BID) | ORAL | 0 refills | Status: AC
Start: 2017-04-01 — End: ?

## 2017-04-01 MED ORDER — CYCLOBENZAPRINE HCL 10 MG PO TABS
10.0000 mg | ORAL_TABLET | Freq: Once | ORAL | Status: AC
  Administered 2017-04-01: 10 mg via ORAL
  Filled 2017-04-01: qty 1

## 2017-04-01 MED ORDER — KETOROLAC TROMETHAMINE 60 MG/2ML IM SOLN
30.0000 mg | Freq: Once | INTRAMUSCULAR | Status: AC
  Administered 2017-04-01: 30 mg via INTRAMUSCULAR
  Filled 2017-04-01: qty 2

## 2017-04-01 NOTE — ED Provider Notes (Signed)
[facility]-EMERGENCY DEPT Provider Note   CSN: [id number] Arrival date & time: 04/01/17  1441     History   Chief Complaint Chief Complaint  Patient presents with  . Hip Pain    HPI Nathan Dixon is a 26 y.o. male who presents to the ED with left hip pain. Patient reports that certain positions increase the pain. Patient does not remember any trauma to the area.  HPI  History reviewed. No pertinent past medical history.  There are no active problems to display for this patient.   History reviewed. No pertinent surgical history.     Home Medications    Prior to Admission medications   Medication Sig Start Date End Date Taking? Authorizing Provider  acetaminophen (TYLENOL) 500 MG tablet Take 1,000 mg by mouth every 6 (six) hours as needed for moderate pain or headache.    [provider]  amoxicillin (AMOXIL) 500 MG capsule Take 1 capsule (500 mg total) by mouth 3 (three) times daily. 05/25/16   Janne NapoleonNeese, Hope M, NP  aspirin-sod bicarb-citric acid (ALKA-SELTZER) 325 MG TBEF tablet Take 325 mg by mouth every 6 (six) hours as needed (cold symptoms).    [provider]  cephALEXin (KEFLEX) 500 MG capsule Take 1 capsule (500 mg total) by mouth 4 (four) times daily. 06/24/16   Janne NapoleonNeese, Hope M, NP  clotrimazole (LOTRIMIN) 1 % cream Apply to affected area 2 times daily 02/14/16   Raeford RazorKohut, Stephen, MD  cyclobenzaprine (FLEXERIL) 10 MG tablet Take 1 tablet (10 mg total) by mouth 2 (two) times daily as needed for muscle spasms. 04/01/17   Janne NapoleonNeese, Hope M, NP  diclofenac (VOLTAREN) 50 MG EC tablet Take 1 tablet (50 mg total) by mouth 2 (two) times daily. 04/01/17   Janne NapoleonNeese, Hope M, NP  doxycycline (VIBRAMYCIN) 100 MG capsule Take 1 capsule (100 mg total) by mouth 2 (two) times daily. 02/24/16   Lawyer, Cristal Deerhristopher, PA-C  famotidine (PEPCID) 20 MG tablet Take 1 tablet (20 mg total) by mouth 2 (two) times daily. 02/14/16   Raeford RazorKohut, Stephen, MD    HYDROcodone-acetaminophen (NORCO/VICODIN) 5-325 MG tablet Take 1 tablet by mouth every 6 (six) hours as needed for moderate pain. 02/24/16   Lawyer, Cristal Deerhristopher, PA-C  naproxen (NAPROSYN) 375 MG tablet Take 1 tablet (375 mg total) by mouth 2 (two) times daily. 06/24/16   Janne NapoleonNeese, Hope M, NP  promethazine (PHENERGAN) 25 MG tablet Take 1 tablet (25 mg total) by mouth every 6 (six) hours as needed for nausea or vomiting. 02/06/16   Donnetta Hutchingook, Brian, MD  pseudoephedrine (SUDAFED) 30 MG tablet Take 1 tablet (30 mg total) by mouth every 6 (six) hours as needed for congestion. 05/25/16   Janne NapoleonNeese, Hope M, NP    Family History History reviewed. No pertinent family history.  Social History Social History   Tobacco Use  . Smoking status: Former Smoker    Types: Cigarettes    Last attempt to quit: 06/18/2016    Years since quitting: 0.7  . Smokeless tobacco: Never Used  Substance Use Topics  . Alcohol use: Yes    Comment: occ  . Drug use: Yes    Types: Marijuana     Allergies   Patient has no known allergies.   Review of Systems Review of Systems  Constitutional: Negative for fever.  HENT: Negative.   Respiratory: Negative for cough and shortness of breath.   Cardiovascular: Negative for chest pain.  Gastrointestinal: Negative for abdominal pain, nausea and vomiting.  Genitourinary:  Negative for dysuria, hematuria and urgency.  Musculoskeletal: Positive for arthralgias. Negative for back pain.       Left hip  Skin: Negative for rash and wound.  Neurological: Negative for syncope and headaches.  Psychiatric/Behavioral: Negative for confusion.     Physical Exam Updated Vital Signs BP 111/67 (BP Location: Left Arm)   Pulse 77   Temp 98.2 F (36.8 C) (Oral)   Resp 20   SpO2 100%   Physical Exam  Constitutional: He is oriented to person, place, and time. He appears well-developed and well-nourished. No distress.  HENT:  Head: Normocephalic and atraumatic.  Eyes: EOM are normal.  Neck:  Normal range of motion. Neck supple.  Cardiovascular: Normal rate and regular rhythm.  Pulmonary/Chest: Effort normal and breath sounds normal.  Abdominal: Soft. Bowel sounds are normal. There is no tenderness.  Musculoskeletal:       Left hip: He exhibits tenderness. He exhibits normal range of motion, no swelling, no crepitus, no deformity and no laceration.  Pedal pulses 2+, adequate circulation, pain to the lateral aspect with range of motion of the left hip. No wound, no ecchymosis, no rash noted. I am able to do full passive range of motion, however, patient c/o pain with internal and external rotation.   Neurological: He is alert and oriented to person, place, and time. No cranial nerve deficit.  Skin: Skin is warm and dry.  Nursing note and vitals reviewed.    ED Treatments / Results  Labs (all labs ordered are listed, but only abnormal results are displayed) Labs Reviewed - No data to display  Radiology Dg Hip Unilat W Or Wo Pelvis 2-3 Views Left  Result Date: 04/01/2017 CLINICAL DATA:  Initial evaluation for acute lateral left hip pain. No injury. EXAM: DG HIP (WITH OR WITHOUT PELVIS) 2-3V LEFT COMPARISON:  None. FINDINGS: There is no evidence of hip fracture or dislocation. There is no evidence of arthropathy or other focal bone abnormality. IMPRESSION: Negative radiograph of the left hip. Electronically Signed   By: Rise MuBenjamin  McClintock M.D.   On: 04/01/2017 21:25    Procedures Procedures (including critical care time)  Medications Ordered in ED Medications  cyclobenzaprine (FLEXERIL) tablet 10 mg (10 mg Oral Given 04/01/17 2019)  ketorolac (TORADOL) injection 30 mg (30 mg Intramuscular Given 04/01/17 2019)     Initial Impression / Assessment and Plan / ED Course  I have reviewed the triage vital signs and the nursing notes. 26 y.o. male with left hip pain stable for d/c without neuro deficits and no fracture or dislocation noted on x-ray. Discussed this case with Dr.  Jacqulyn BathLong and will give patient referral to ortho. Will treat with muscle relaxant and NSAIDS.   Final Clinical Impressions(s) / ED Diagnoses   Final diagnoses:  Left hip pain    ED Discharge Orders        Ordered    diclofenac (VOLTAREN) 50 MG EC tablet  2 times daily     04/01/17 2205    cyclobenzaprine (FLEXERIL) 10 MG tablet  2 times daily PRN     04/01/17 2205       Kerrie Buffaloeese, Hope GuernseyM, NP 04/01/17 2206    Maia PlanLong, Joshua G, MD 04/02/17 1157

## 2017-04-01 NOTE — ED Triage Notes (Signed)
Pt reports L hip pain when he moves his leg in certain angles (bending knee chest, straightening leg, rotating leg). Pt ambulatory to triage. No known injury.

## 2017-04-01 NOTE — Discharge Instructions (Signed)
Do not take the muscle relaxant if driving as it can make you sleepy.follow up with Dr. Pearletha ForgeHudnall, return here as needed.

## 2017-04-05 ENCOUNTER — Ambulatory Visit: Payer: Self-pay | Admitting: Family Medicine

## 2018-05-01 IMAGING — CR DG HIP (WITH OR WITHOUT PELVIS) 2-3V*L*
3 series · 3 of 3 positions shown · non-contrast
Comparison: None.

CLINICAL DATA: Initial evaluation for acute lateral left hip pain.
No injury.

EXAM:
DG HIP (WITH OR WITHOUT PELVIS) 2-3V LEFT

[t pelvis ap]
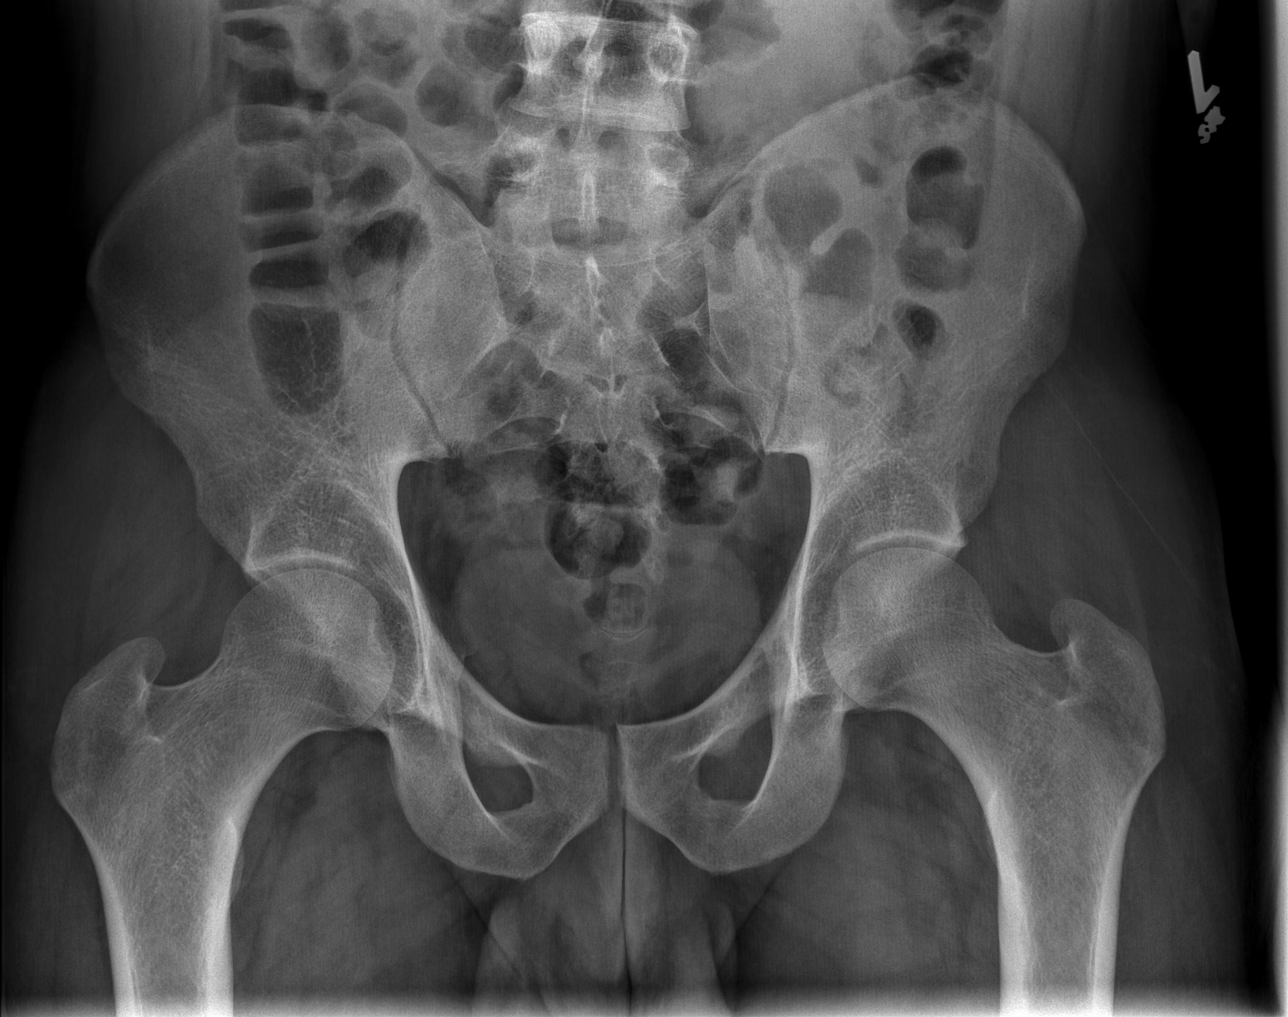

[t hip ap left]
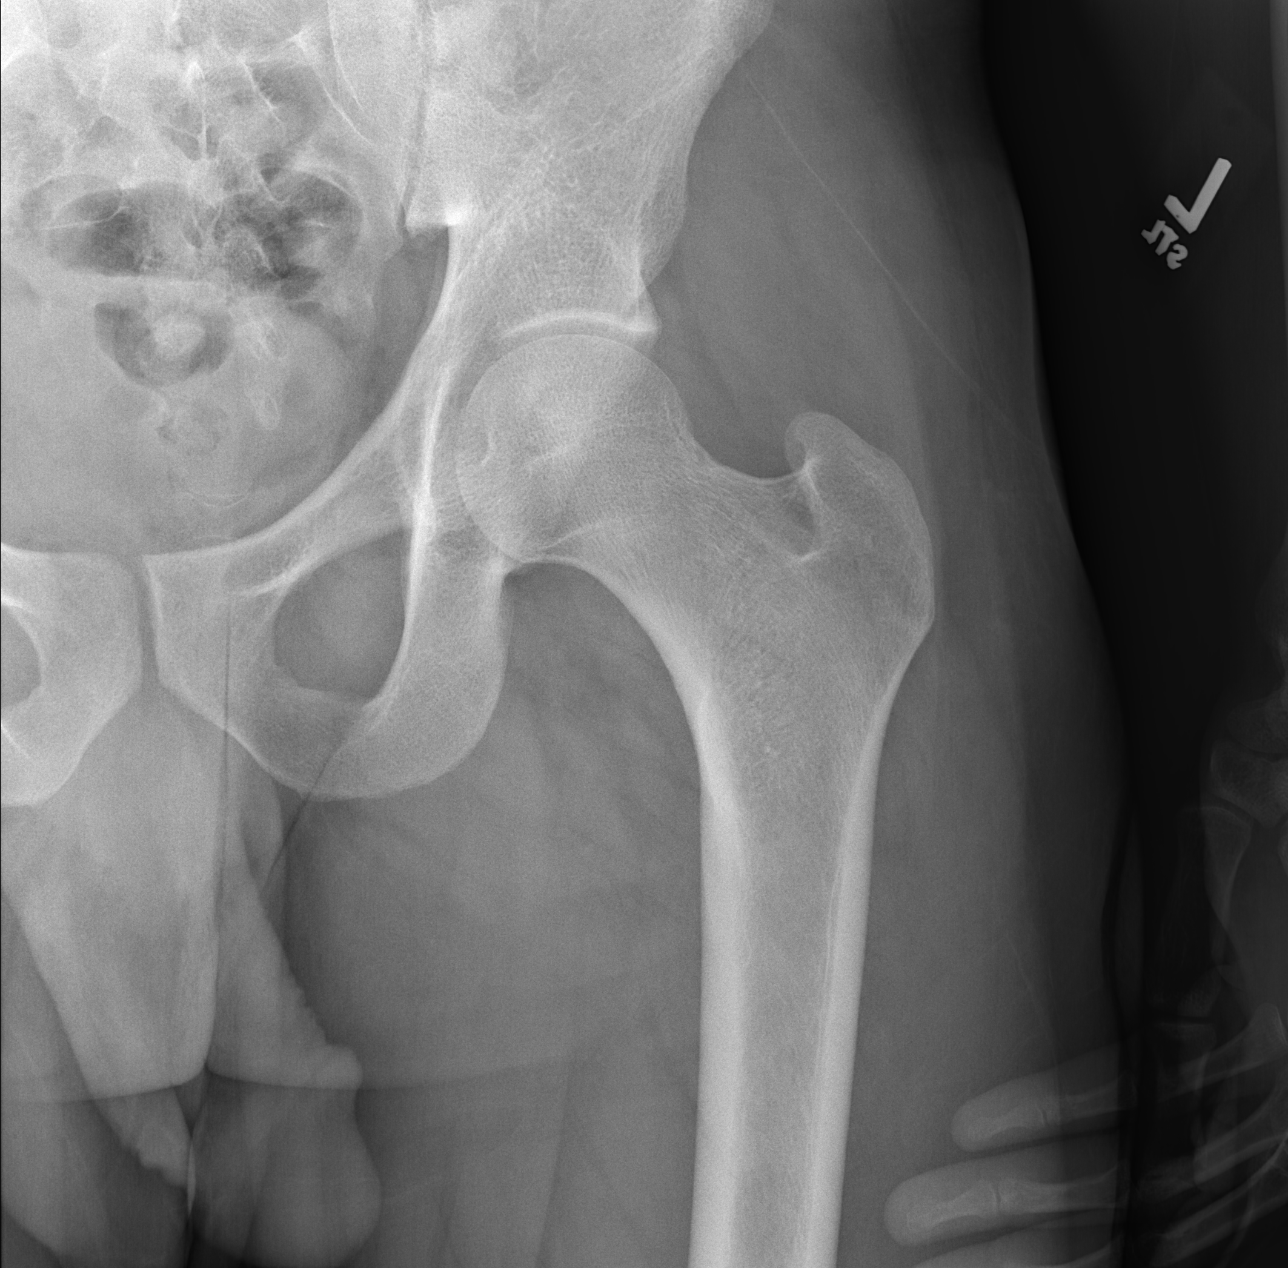

[t hip frog leg left]
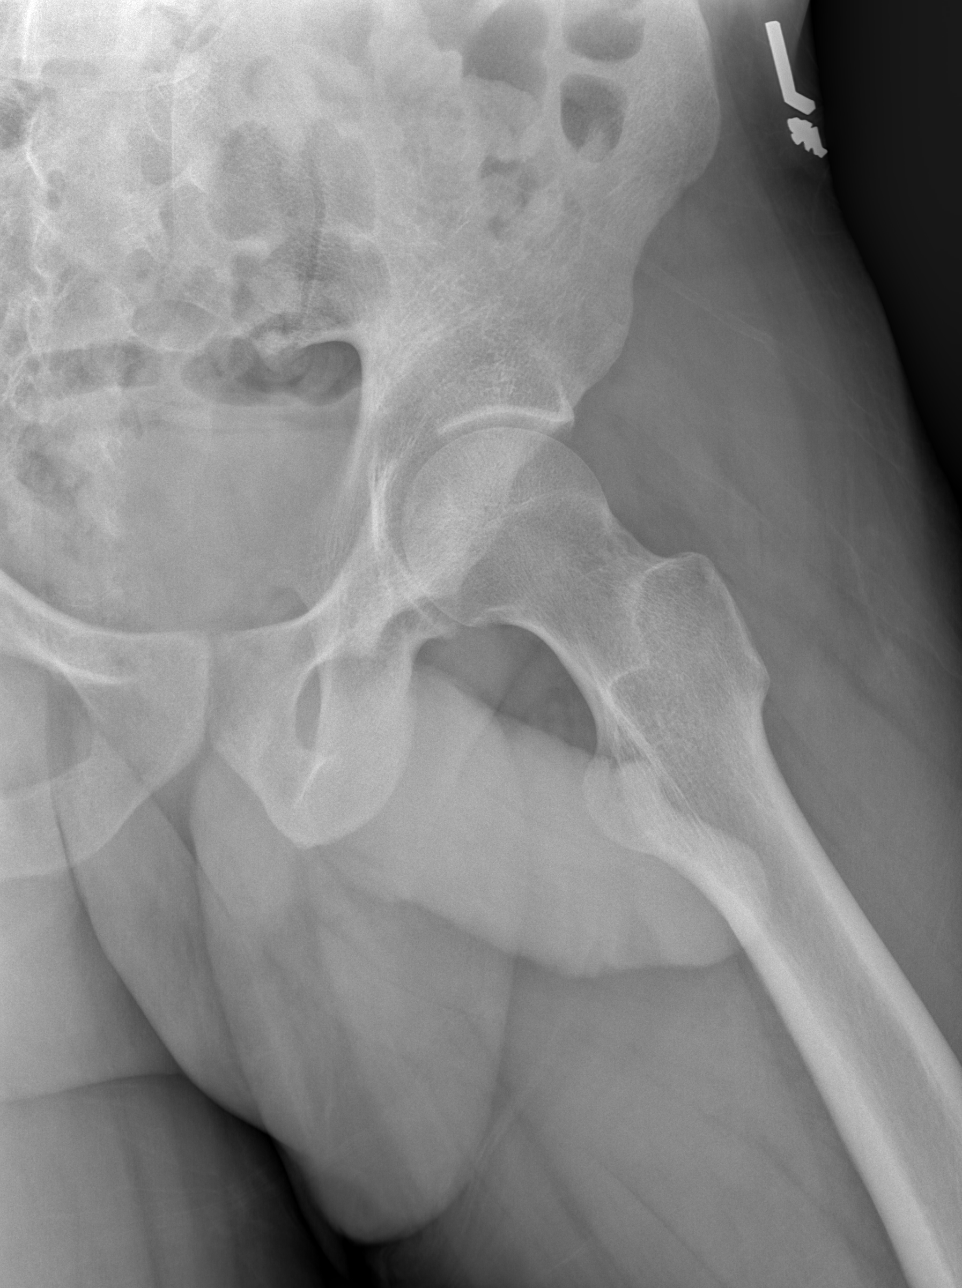

[3 of 3 positions shown; findings below may reference images not displayed]

FINDINGS: There is no evidence of hip fracture or dislocation. There is no
evidence of arthropathy or other focal bone abnormality.
IMPRESSION: Negative radiograph of the left hip.

## 2018-10-02 ENCOUNTER — Encounter (HOSPITAL_COMMUNITY): Payer: Self-pay | Admitting: Emergency Medicine

## 2018-10-02 ENCOUNTER — Emergency Department (HOSPITAL_COMMUNITY)
Admission: EM | Admit: 2018-10-02 | Discharge: 2018-10-02 | Disposition: A | Discharge status: Home / Self Care | Payer: Self-pay | Attending: Emergency Medicine | Admitting: Emergency Medicine

## 2018-10-02 ENCOUNTER — Emergency Department (HOSPITAL_COMMUNITY): Payer: Self-pay

## 2018-10-02 ENCOUNTER — Other Ambulatory Visit: Payer: Self-pay

## 2018-10-02 DIAGNOSIS — Z87891 Personal history of nicotine dependence: Secondary | ICD-10-CM | POA: Insufficient documentation

## 2018-10-02 DIAGNOSIS — S92424A Nondisplaced fracture of distal phalanx of right great toe, initial encounter for closed fracture: Secondary | ICD-10-CM | POA: Insufficient documentation

## 2018-10-02 DIAGNOSIS — Y9289 Other specified places as the place of occurrence of the external cause: Secondary | ICD-10-CM | POA: Insufficient documentation

## 2018-10-02 DIAGNOSIS — Y9389 Activity, other specified: Secondary | ICD-10-CM | POA: Insufficient documentation

## 2018-10-02 DIAGNOSIS — Y999 Unspecified external cause status: Secondary | ICD-10-CM | POA: Insufficient documentation

## 2018-10-02 DIAGNOSIS — Z79899 Other long term (current) drug therapy: Secondary | ICD-10-CM | POA: Insufficient documentation

## 2018-10-02 DIAGNOSIS — W208XXA Other cause of strike by thrown, projected or falling object, initial encounter: Secondary | ICD-10-CM | POA: Insufficient documentation

## 2018-10-02 NOTE — ED Notes (Signed)
Patient given discharge teaching and verbalized understanding. Patient ambulated out of ED w/ crutches.

## 2018-10-02 NOTE — ED Provider Notes (Signed)
[facility]-EMERGENCY DEPT Provider Note   CSN: [id number] Arrival date & time: 10/02/18  [id number]    History   Chief Complaint Chief Complaint  Patient presents with  . Foot Pain    HPI Nathan Dixon is a 28 y.o. male.     HPI   Nathan Dixon is a 28 y.o. male, patient with no pertinent past medical history, presenting to the ED with right great toe injury that occurred yesterday.  Patient states he was moving a close washer, slipped, and the washer dropped on the end of his shoe, hitting his big toe.  Pain was minimal yesterday, however, patient woke up with throbbing, severe pain to the right great toe.  He took 600 mg ibuprofen, which improved his pain.  Denies numbness, weakness, other injuries.      History reviewed. No pertinent past medical history.  There are no active problems to display for this patient.   History reviewed. No pertinent surgical history.      Home Medications    Prior to Admission medications   Medication Sig Start Date End Date Taking? Authorizing Provider  acetaminophen (TYLENOL) 500 MG tablet Take 1,000 mg by mouth every 6 (six) hours as needed for moderate pain or headache.    [provider]  amoxicillin (AMOXIL) 500 MG capsule Take 1 capsule (500 mg total) by mouth 3 (three) times daily. 05/25/16   Janne NapoleonNeese, Hope M, NP  aspirin-sod bicarb-citric acid (ALKA-SELTZER) 325 MG TBEF tablet Take 325 mg by mouth every 6 (six) hours as needed (cold symptoms).    [provider]  cephALEXin (KEFLEX) 500 MG capsule Take 1 capsule (500 mg total) by mouth 4 (four) times daily. 06/24/16   Janne NapoleonNeese, Hope M, NP  clotrimazole (LOTRIMIN) 1 % cream Apply to affected area 2 times daily 02/14/16   Raeford RazorKohut, Stephen, MD  cyclobenzaprine (FLEXERIL) 10 MG tablet Take 1 tablet (10 mg total) by mouth 2 (two) times daily as needed for muscle spasms. 04/01/17   Janne NapoleonNeese, Hope M, NP  diclofenac (VOLTAREN) 50 MG EC tablet Take 1 tablet (50  mg total) by mouth 2 (two) times daily. 04/01/17   Janne NapoleonNeese, Hope M, NP  doxycycline (VIBRAMYCIN) 100 MG capsule Take 1 capsule (100 mg total) by mouth 2 (two) times daily. 02/24/16   Lawyer, Cristal Deerhristopher, PA-C  famotidine (PEPCID) 20 MG tablet Take 1 tablet (20 mg total) by mouth 2 (two) times daily. 02/14/16   Raeford RazorKohut, Stephen, MD  HYDROcodone-acetaminophen (NORCO/VICODIN) 5-325 MG tablet Take 1 tablet by mouth every 6 (six) hours as needed for moderate pain. 02/24/16   Lawyer, Cristal Deerhristopher, PA-C  naproxen (NAPROSYN) 375 MG tablet Take 1 tablet (375 mg total) by mouth 2 (two) times daily. 06/24/16   Janne NapoleonNeese, Hope M, NP  promethazine (PHENERGAN) 25 MG tablet Take 1 tablet (25 mg total) by mouth every 6 (six) hours as needed for nausea or vomiting. 02/06/16   Donnetta Hutchingook, Brian, MD  pseudoephedrine (SUDAFED) 30 MG tablet Take 1 tablet (30 mg total) by mouth every 6 (six) hours as needed for congestion. 05/25/16   Janne NapoleonNeese, Hope M, NP    Family History History reviewed. No pertinent family history.  Social History Social History   Tobacco Use  . Smoking status: Former Smoker    Types: Cigarettes    Last attempt to quit: 06/18/2016    Years since quitting: 2.2  . Smokeless tobacco: Never Used  Substance Use Topics  . Alcohol use: Yes    Comment: occ  .  Drug use: Yes    Types: Marijuana     Allergies   Patient has no known allergies.   Review of Systems Review of Systems  Musculoskeletal: Positive for arthralgias.  Neurological: Negative for weakness and numbness.     Physical Exam Updated Vital Signs BP 117/73 (BP Location: Right Arm)   Pulse 87   Temp 98.3 F (36.8 C) (Oral)   Resp 16   Ht  (1.778 m)   Wt 81.6 kg   SpO2 98%   BMI 25.83 kg/m   Physical Exam Vitals signs and nursing note reviewed.  Constitutional:      General: He is not in acute distress.    Appearance: He is well-developed. He is not diaphoretic.  HENT:     Head: Normocephalic and atraumatic.  Eyes:      Conjunctiva/sclera: Conjunctivae normal.  Neck:     Musculoskeletal: Neck supple.  Cardiovascular:     Rate and Rhythm: Normal rate and regular rhythm.     Pulses:          Dorsalis pedis pulses are 2+ on the right side.       Posterior tibial pulses are 2+ on the right side.  Pulmonary:     Effort: Pulmonary effort is normal.  Musculoskeletal:     Comments: Tenderness without notable swelling or color change to the right great toe just distal to the MTP. No tenderness or other abnormality noted proximal to the MTP. No tenderness or pain in the other toes.  Skin:    General: Skin is warm and dry.     Capillary Refill: Capillary refill takes less than 2 seconds.     Coloration: Skin is not pale.  Neurological:     Mental Status: He is alert.     Comments: Sensation light touch grossly intact in the right great toe and into the right foot. Motor function intact in the right great toe.  Psychiatric:        Behavior: Behavior normal.      ED Treatments / Results  Labs (all labs ordered are listed, but only abnormal results are displayed) Labs Reviewed - No data to display  EKG None  Radiology Dg Toe Great Right  Result Date: 10/02/2018 CLINICAL DATA:  Right great toe pain after dropping a washer on it yesterday. EXAM: RIGHT GREAT TOE COMPARISON:  None. FINDINGS: Tiny nondisplaced fracture at the lateral base of the first distal phalanx. No dislocation. Joint spaces are preserved. Bone mineralization is normal. Soft tissues are unremarkable. IMPRESSION: 1. Tiny nondisplaced fracture at the lateral base of the first distal phalanx. Electronically Signed   By: Obie Dredge M.D.   On: 10/02/2018 09:01    Procedures .Splint Application Date/Time: 10/02/2018 9:00 AM Performed by: Anselm Pancoast, PA-C Authorized by: Anselm Pancoast, PA-C   Consent:    Consent obtained:  Verbal   Consent given by:  Patient Pre-procedure details:    Sensation:  Normal   Skin color:  Normal  Procedure details:    Laterality:  Right   Location:  Toe   Toe:  R big toe   Splint type: Postop shoe.   Supplies used: Postop shoe. Post-procedure details:    Pain:  Improved   Sensation:  Normal   Skin color:  Normal   Patient tolerance of procedure:  Tolerated well, no immediate complications Comments:     Procedure was performed by the Ortho Tech with my evaluation before and after. I was  available for consultation throughout the procedure.   (including critical care time)  Medications Ordered in ED Medications - No data to display   Initial Impression / Assessment and Plan / ED Course  I have reviewed the triage vital signs and the nursing notes.  Pertinent labs & imaging results that were available during my care of the patient were reviewed by me and considered in my medical decision making (see chart for details).        Patient presents with a toe injury.  No evidence of neurovascular compromise.  Nondisplaced fracture noted to the first distal phalanx.  Patient placed in postop shoe, given crutches, orthopedic follow-up as needed.  Otherwise, patient has been definitively managed for this fracture. The patient was given instructions for home care as well as return precautions. Patient voices understanding of these instructions, accepts the plan, and is comfortable with discharge.  Final Clinical Impressions(s) / ED Diagnoses   Final diagnoses:  Closed nondisplaced fracture of distal phalanx of right great toe, initial encounter    ED Discharge Orders    None       Concepcion Living 10/02/18 0935    Derwood Kaplan, MD 10/03/18 [phone]

## 2018-10-02 NOTE — ED Triage Notes (Addendum)
Patient arrived by self from home. Pt c/o RT foot pain. Pt dropped his at home washer on his big toe last night. Pt stated when ambulating he experiences the pain.   Pt states pain is 08/10 when ambulating.

## 2018-10-02 NOTE — Discharge Instructions (Addendum)
You have been seen today for a toe injury.  There was a small fracture noted on x-ray. Antiinflammatory medications: Take 600 mg of ibuprofen every 6 hours or 440 mg (over the counter dose) to 500 mg (prescription dose) of naproxen every 12 hours for the next 3 days. After this time, these medications may be used as needed for pain. Take these medications with food to avoid upset stomach. Choose only one of these medications, do not take them together. Acetaminophen (generic for Tylenol): Should you continue to have additional pain while taking the ibuprofen or naproxen, you may add in acetaminophen as needed. Your daily total maximum amount of acetaminophen from all sources should be limited to 4000mg /day for persons without liver problems, or 2000mg /day for those with liver problems. Ice: May apply ice to the area over the next 24 hours for 15 minutes at a time to reduce swelling. Elevation: Keep the extremity elevated as often as possible to reduce pain and inflammation. Support: Wear the postop shoe for support and comfort. Wear this until pain resolves. You will be weight-bearing as tolerated, which means you can slowly start to put weight on the extremity and increase amount and frequency as pain allows. Follow up: May follow-up with the orthopedist for any further management of this issue. Return: Return to the ED for numbness, weakness, increasing pain, overall worsening symptoms, loss of function, or if symptoms are not improving, you have tried to follow up with the orthopedic specialist, and have been unable to do so.  For prescription assistance, may try using prescription discount sites or apps, such as goodrx.com

## 2019-11-01 IMAGING — CR RIGHT GREAT TOE
3 series · 3 of 3 positions shown · non-contrast
Comparison: None.

CLINICAL DATA: Right great toe pain after dropping a washer on it
yesterday.

EXAM:
RIGHT GREAT TOE

[x toes ap right]
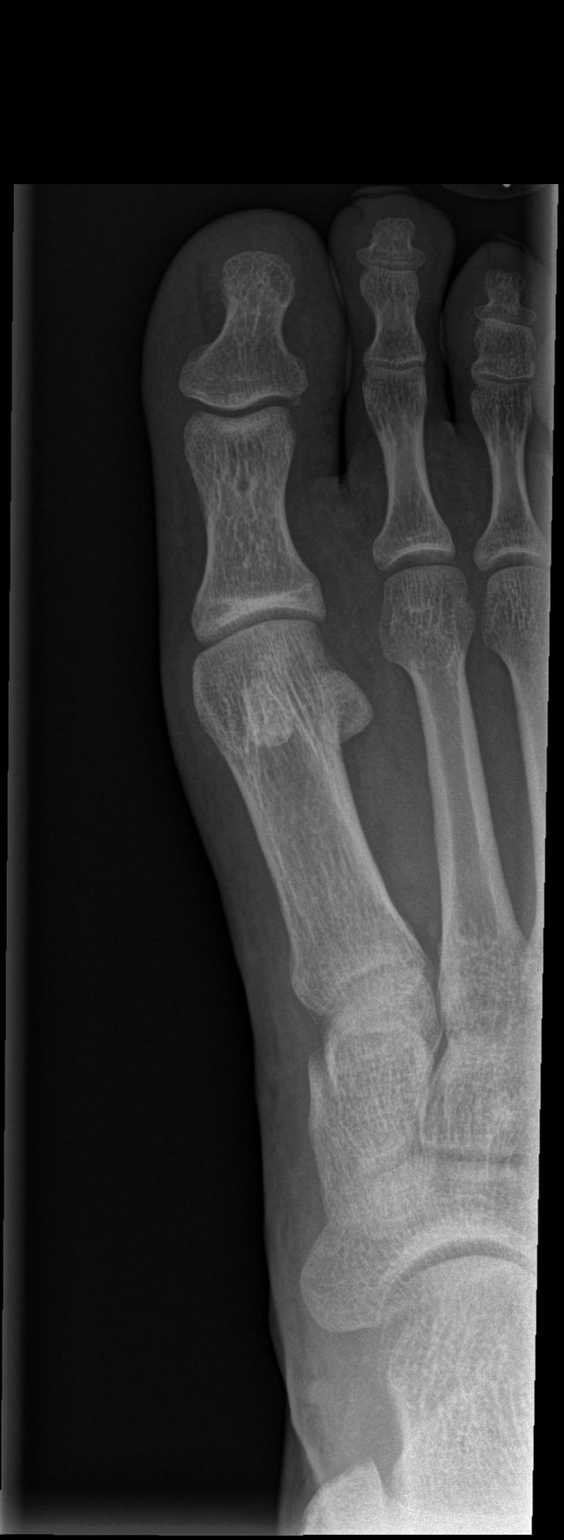

[x toes obl right]
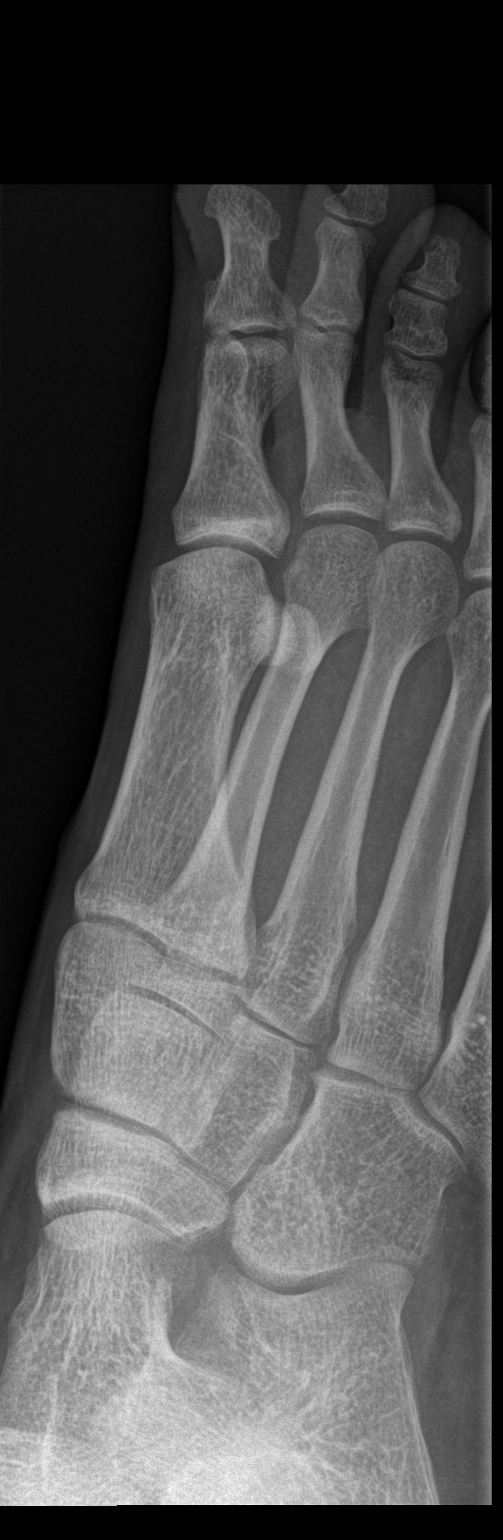

[x toes lat right]
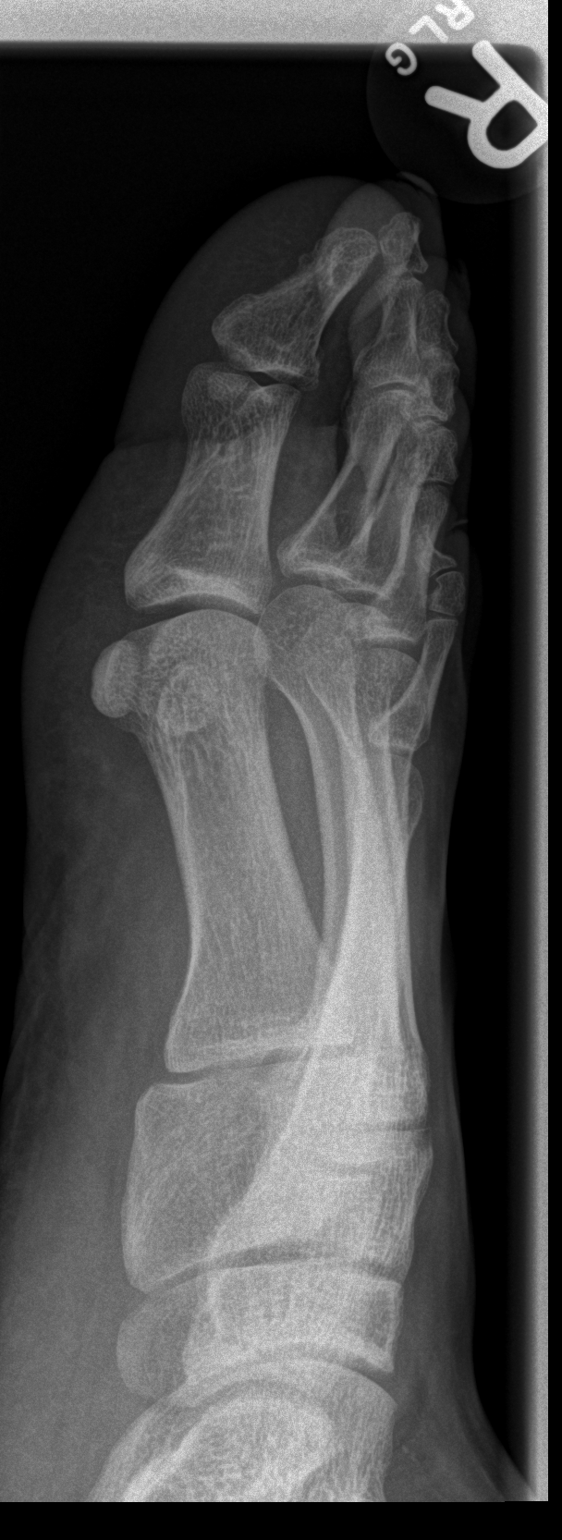

[3 of 3 positions shown; findings below may reference images not displayed]

FINDINGS: Tiny nondisplaced fracture at the lateral base of the first distal
phalanx. No dislocation. Joint spaces are preserved. Bone
mineralization is normal. Soft tissues are unremarkable.
IMPRESSION: 1. Tiny nondisplaced fracture at the lateral base of the first
distal phalanx.
